# Patient Record
Sex: Female | Born: 1950 | Race: Black or African American | Hispanic: No | Marital: Single
Health system: Midwestern US, Community
[De-identification: ages and names within clinical notes are randomized; demographics above are authoritative.]

## PROBLEM LIST (undated history)

## (undated) DIAGNOSIS — M79671 Pain in right foot: Secondary | ICD-10-CM

## (undated) DIAGNOSIS — I120 Hypertensive chronic kidney disease with stage 5 chronic kidney disease or end stage renal disease: Secondary | ICD-10-CM

## (undated) DIAGNOSIS — Z01810 Encounter for preprocedural cardiovascular examination: Secondary | ICD-10-CM

---

## 2015-12-14 ENCOUNTER — Inpatient Hospital Stay: Admit: 2015-12-14 | Payer: MEDICARE | Primary: Internal Medicine

## 2015-12-14 ENCOUNTER — Encounter

## 2015-12-14 DIAGNOSIS — M109 Gout, unspecified: Secondary | ICD-10-CM

## 2015-12-26 ENCOUNTER — Ambulatory Visit: Admit: 2015-12-26 | Discharge: 2015-12-26 | Payer: MEDICARE | Attending: Vascular Surgery | Primary: Internal Medicine

## 2015-12-26 ENCOUNTER — Encounter: Attending: Vascular Surgery | Primary: Internal Medicine

## 2015-12-26 DIAGNOSIS — N189 Chronic kidney disease, unspecified: Secondary | ICD-10-CM

## 2015-12-26 NOTE — Progress Notes (Signed)
Tracey King    Chief Complaint   Patient presents with   ??? End Stage Renal Disease       History and Physical    [name] is a 65 year old female referred to our office for evaluation of long-term dialysis access.  She has never been on dialysis before and states that she is right-handed.  [place] is [place] and is establish new doctor this area.  Stands that dialysis may be imminent for her and is ready to discuss long-term access.  She has no active complaints at this time.    Past Medical History:   Diagnosis Date   ??? Chronic kidney disease    ??? Chronic obstructive pulmonary disease (HCC)    ??? Hypertension      Patient Active Problem List   Diagnosis Code   ??? Chronic kidney disease N18.9     Past Surgical History:   Procedure Laterality Date   ??? HX CHOLECYSTECTOMY     ??? HX GYN         Allergies   Allergen Reactions   ??? Reglan [Metoclopramide Hcl] Anxiety   ??? Erythromycin Nausea and Vomiting     Social History     Social History   ??? Marital status: MARRIED     Spouse name: N/A   ??? Number of children: N/A   ??? Years of education: N/A     Occupational History   ??? Not on file.     Social History Main Topics   ??? Smoking status: Former Smoker     Quit date: 05/06/1968   ??? Smokeless tobacco: Never Used   ??? Alcohol use No   ??? Drug use: No   ??? Sexual activity: Not on file     Other Topics Concern   ??? Not on file     Social History Narrative      History reviewed. No pertinent family history.    Review of Systems    Review of Systems   Constitutional: Negative for chills, diaphoresis, fever, malaise/fatigue and weight loss.   HENT: Negative for hearing loss and sore throat.    Eyes: Negative for blurred vision, photophobia and redness.   Respiratory: Negative for cough, hemoptysis, shortness of breath and wheezing.    Cardiovascular: Negative for chest pain, palpitations and orthopnea.   Gastrointestinal: Negative for abdominal pain, blood in stool,  constipation, diarrhea, heartburn, nausea and vomiting.   Genitourinary: Negative for dysuria, frequency, hematuria and urgency.   Musculoskeletal: Negative for back pain and myalgias.   Skin: Negative for itching and rash.   Neurological: Negative for dizziness, speech change, focal weakness, weakness and headaches.   Endo/Heme/Allergies: Does not bruise/bleed easily.   Psychiatric/Behavioral: Negative for depression and suicidal ideas.            Physical Exam:    Visit Vitals   ??? BP (!) 168/98   ??? Pulse 74   ??? Resp 18   ??? Ht 5\' 5"  (1.651 m)   ??? Wt 152 lb (68.9 kg)   ??? BMI 25.29 kg/m2      Physical Examination: General appearance - alert, well appearing, and in no distress  Mental status - alert, oriented to person, place, and time  Eyes - sclera anicteric, left eye normal, right eye normal  Ears - right ear normal, left ear normal  Nose - normal and patent, no erythema, discharge or polyps  Mouth - mucous membranes moist, pharynx normal without  lesions  Neck - supple, no significant adenopathy  Lymphatics - no palpable lymphadenopathy  Chest -coarse breath sounds bilaterally  Heart - normal rate and regular rhythm  Abdomen - soft, nontender, nondistended, no masses or organomegaly  Extremities -1+ radial pulse bilaterally.  No visible cephalic vein in the forearm region.  Small antecubital vein on the left is visible large on the right.    Impression and Plan:    ICD-10-CM ICD-9-CM    1. Chronic kidney disease, unspecified stage N18.9 585.9 DUPLEX UPPER EXT VEIN MAP BILAT     Orders Placed This Encounter   ??? DUPLEX UPPER EXT VEIN MAP BILAT     Ms. Tracey Loraassell and I had a long discussion with dialysis access.  We reviewed the need for a vein mapping study as well as the indications for where we put the dialysis access.  [name] voiced understanding of her plan and she will see us in 2 weeks to nail down date for dialysis access as well as to review her vein mapping study.    Follow-up Disposition:   Return in about 2 weeks (around 01/09/2016) for Vascular labs, surgery talk.      The treatment plan was reviewed with the patient in detail.  The patient voiced understanding of this plan and all questions and concerns were addressed.  The patient agrees with this plan.  We discussed the signs and symptoms that would require earlier attention or intervention.     The patient was given educational material related to his/her visit and the patient has voiced understanding of the material.     I appreciate the opportunity to participate in the care of your patient.  I will be sure to keep you informed of any subsequent changes in the treatment plan.  If you have any questions or concerns, please feel free to contact me.  Ouida SillsJason L Ailana Cuadrado, MD    PLEASE NOTE:  This document has been produced using voice recognition software. Unrecognized errors in transcription may be present.

## 2016-01-03 ENCOUNTER — Ambulatory Visit: Payer: MEDICARE | Primary: Internal Medicine

## 2016-01-05 ENCOUNTER — Inpatient Hospital Stay: Admit: 2016-01-05 | Payer: MEDICARE | Attending: Vascular Surgery | Primary: Internal Medicine

## 2016-01-05 DIAGNOSIS — N189 Chronic kidney disease, unspecified: Secondary | ICD-10-CM

## 2016-01-05 NOTE — Procedures (Signed)
[facility]  *** FINAL REPORT ***    Name: Tracey King, Tracey King  MRN: [id number]    Outpatient  DOB: 22 Nov 1950  HIS Order #: [id number]  TRAKnet Visit #: [id number]  Date: 05 Jan 2016    TYPE OF TEST: Peripheral Venous Testing    REASON FOR TEST  Surveillance    Right Arm:-  Deep venous thrombosis:           No  Superficial venous thrombosis:    No    Left Arm:-  Deep venous thrombosis:           No  Superficial venous thrombosis:    No    Vein Mapping:    Diam.   Depth  (mm)    Right Cephalic Vein:    Axilla:    Upper Arm:       2.9     6.6    Lower Arm:       2.6     1.9    Antecubital:     3.6     4.8    Upper Forearm:    Forearm:    Wrist:    Right Basilic Vein:    Upper Arm:       4.3     9.3    Lower Arm:       3.7     9.2    Antecubital:     2.9     6.9    Upper Forearm:   2.1     4.4    Lower Forearm:   2.3     3.5    Wrist:           1.7     2.2    R.Median Cubital:  2.4     4.7    Right Brachial Vein:    Proximal:        4.1    11.8    Distal:          2.9     5.5    Right Subclavian Artery:  Right Axillary Artery  :    Left Cephalic Vein:    Axilla:          0.0     0.0    Upper Arm:       2.0     0.0    Lower Arm:       1.7     0.0    Antecubital:     2.4     0.0    Upper Forearm:   1.4     0.0    Forearm:         1.2     0.0    Wrist:           1.5     0.0    Left Basilic Vein:    Upper Arm:       3.0     0.0    Lower Arm:       2.1     0.0    Antecubital:     2.3     0.0    Upper Forearm:      0.0    Lower Forearm:      0.0    Wrist:              0.0    L.Median Cubital:  2.5     2.6    Left Brachial Vein:  Proximal:        2.1    16.6    Distal:          2.3     6.1    Left Subclavian Artery:  Left Axillary Artery  :      INTERPRETATION/FINDINGS  Duplex images were obtained using 2-D gray scale, color flow, and  spectral Doppler analysis.  Bilateral mapping is done with the application of tourniquet.    Right arm:  1. Vein diameters as noted above.  2. No evidence of deep venous thrombosis  in brachial vein.  3. No evidence of superficial venous thrombosis in the superficial  veins.  4. Bifurcation noted distal to cubital fossa.  5. The cephalic vein in the forearm was too small to well visualize.    Left arm:  1. Vein diameters as noted above.  2. No evidence of deep venous thrombosis in brachial vein.  3. No evidence of superficial venous thrombosis in the superficial  veins.  4. Bifurcation noted distal to cubital fossa.  5. The basilic vein in the forearm was too small to well visualize.    ADDITIONAL COMMENTS  The bilateral brachial and radial arteries are widely patent.    I have personally reviewed the data relevant to the interpretation of  this  study.    TECHNOLOGIST: Susy Frizzle, RDCS, RVT  Signed: 01/05/2016 02:23 PM    PHYSICIAN: Tomasita Morrow. Fransisco Hertz, MD  Signed: 01/05/2016 03:13 PM

## 2016-01-05 NOTE — Procedures (Signed)
[facility]  *** FINAL REPORT ***    Name: Tracey King, Tracey King  MRN: [id number]    Outpatient  DOB: 22 Nov 1950  HIS Order #: [id number]  TRAKnet Visit #: [id number]  Date: 05 Jan 2016    TYPE OF TEST: Peripheral Venous Testing    REASON FOR TEST  Surveillance    Right Arm:-  Deep venous thrombosis:           No  Superficial venous thrombosis:    No    Left Arm:-  Deep venous thrombosis:           No  Superficial venous thrombosis:    No    Vein Mapping:    Diam.   Depth  (mm)    Right Cephalic Vein:    Axilla:    Upper Arm:       2.9     6.6    Lower Arm:       2.6     1.9    Antecubital:     3.6     4.8    Upper Forearm:    Forearm:    Wrist:    Right Basilic Vein:    Upper Arm:       4.3     9.3    Lower Arm:       3.7     9.2    Antecubital:     2.9     6.9    Upper Forearm:   2.1     4.4    Lower Forearm:   2.3     3.5    Wrist:           1.7     2.2    R.Median Cubital:  2.4     4.7    Right Brachial Vein:    Proximal:        4.1    11.8    Distal:          2.9     5.5    Right Subclavian Artery:  Right Axillary Artery  :    Left Cephalic Vein:    Axilla:          0.0     0.0    Upper Arm:       2.0     0.0    Lower Arm:       1.7     0.0    Antecubital:     2.4     0.0    Upper Forearm:   1.4     0.0    Forearm:         1.2     0.0    Wrist:           1.5     0.0    Left Basilic Vein:    Upper Arm:       3.0     0.0    Lower Arm:       2.1     0.0    Antecubital:     2.3     0.0    Upper Forearm:      0.0    Lower Forearm:      0.0    Wrist:              0.0    L.Median Cubital:  2.5     2.6    Left Brachial Vein:  Proximal:        2.1    16.6    Distal:          2.3     6.1    Left Subclavian Artery:  Left Axillary Artery  :      INTERPRETATION/FINDINGS  Duplex images were obtained using 2-D gray scale, color flow, and  spectral Doppler analysis.  Bilateral mapping is done with the application of tourniquet.    Right arm:  1. Vein diameters as noted above.   2. No evidence of deep venous thrombosis in brachial vein.  3. No evidence of superficial venous thrombosis in the superficial  veins.  4. Bifurcation noted distal to cubital fossa.  5. The cephalic vein in the forearm was too small to well visualize.    Left arm:  1. Vein diameters as noted above.  2. No evidence of deep venous thrombosis in brachial vein.  3. No evidence of superficial venous thrombosis in the superficial  veins.  4. Bifurcation noted distal to cubital fossa.  5. The basilic vein in the forearm was too small to well visualize.    ADDITIONAL COMMENTS  The bilateral brachial and radial arteries are widely patent.    I have personally reviewed the data relevant to the interpretation of  this  study.    TECHNOLOGIST: Susy FrizzleJarobvey Matthews, RDCS, RVT  Signed: 01/05/2016 02:23 PM    PHYSICIAN: Tomasita MorrowAndrew P. Fransisco HertzLoiacono, MD  Signed: 01/05/2016 03:13 PM

## 2016-01-10 ENCOUNTER — Ambulatory Visit: Attending: Vascular Surgery | Primary: Internal Medicine

## 2016-01-10 ENCOUNTER — Encounter: Attending: Vascular Surgery | Primary: Internal Medicine

## 2016-01-10 ENCOUNTER — Ambulatory Visit: Admit: 2016-01-10 | Discharge: 2016-01-10 | Payer: MEDICARE | Attending: Vascular Surgery | Primary: Internal Medicine

## 2016-01-10 DIAGNOSIS — N189 Chronic kidney disease, unspecified: Secondary | ICD-10-CM

## 2016-01-10 NOTE — Progress Notes (Signed)
[name]    Chief Complaint   Patient presents with   ??? End Stage Renal Disease       History and Physical    [name] returns to our office for discussion of long-term dialysis access.  She complains of a cough at this point which she states was previously better when her sarcoidosis is better controlled more recently after moving back to [place] she has noted increasing her cough.  She has no other complaints.  She is scheduled to see her new pulmonologist in this month.    Past Medical History:   Diagnosis Date   ??? Chronic kidney disease    ??? Chronic obstructive pulmonary disease (HCC)    ??? Hypertension      Patient Active Problem List   Diagnosis Code   ??? Chronic kidney disease N18.9     Past Surgical History:   Procedure Laterality Date   ??? HX CHOLECYSTECTOMY     ??? HX GYN       Current Outpatient Prescriptions   Medication Sig Dispense Refill   ??? aspirin 81 mg chewable tablet Take 81 mg by mouth daily.     ??? colchicine 0.6 mg tablet Take 0.6 mg by mouth daily.     ??? fluticasone-salmeterol (ADVAIR) 100-50 mcg/dose diskus inhaler Take 1 Puff by inhalation every twelve (12) hours.     ??? furosemide (LASIX) 20 mg tablet Take  by mouth daily.     ??? metoprolol succinate (TOPROL-XL) 25 mg XL tablet Take  by mouth daily.     ??? Polyethylene Glycol 3350 powd by Does Not Apply route.     ??? amLODIPine (NORVASC) 10 mg tablet Take  by mouth daily.     ??? calcitRIOL (ROCALTROL) 0.25 mcg capsule Take 0.25 mcg by mouth daily.     ??? doxazosin (CARDURA) 1 mg tablet Take  by mouth daily.       Allergies   Allergen Reactions   ??? Reglan [Metoclopramide Hcl] Anxiety   ??? Erythromycin Nausea and Vomiting     Social History     Social History   ??? Marital status: MARRIED     Spouse name: N/A   ??? Number of children: N/A   ??? Years of education: N/A     Occupational History   ??? Not on file.     Social History Main Topics   ??? Smoking status: Former Smoker     Quit date: 05/06/1968   ??? Smokeless tobacco: Never Used   ??? Alcohol use No    ??? Drug use: No   ??? Sexual activity: Not on file     Other Topics Concern   ??? Not on file     Social History Narrative      History reviewed. No pertinent family history.    Review of Systems    Review of Systems   Constitutional: Negative for chills, diaphoresis, fever, malaise/fatigue and weight loss.   HENT: Negative for hearing loss and sore throat.    Eyes: Negative for blurred vision, photophobia and redness.   Respiratory: Positive for cough. Negative for hemoptysis, shortness of breath and wheezing.    Cardiovascular: Negative for chest pain, palpitations and orthopnea.   Gastrointestinal: Negative for abdominal pain, blood in stool, constipation, diarrhea, heartburn, nausea and vomiting.   Genitourinary: Negative for dysuria, frequency, hematuria and urgency.   Musculoskeletal: Negative for back pain and myalgias.   Skin: Negative for itching and rash.   Neurological: Negative  for dizziness, speech change, focal weakness, weakness and headaches.   Endo/Heme/Allergies: Does not bruise/bleed easily.   Psychiatric/Behavioral: Negative for depression and suicidal ideas.            Physical Exam:    Visit Vitals   ??? BP 154/82 (BP 1 Location: Right arm, BP Patient Position: Sitting)   ??? Pulse 78   ??? Resp 18   ??? Ht 5\' 5"  (1.651 m)   ??? Wt 152 lb (68.9 kg)   ??? BMI 25.29 kg/m2      Physical Examination: General appearance - alert, well appearing, and in no distress  Mental status - alert, oriented to person, place, and time  Eyes - sclera anicteric, left eye normal, right eye normal  Ears - right ear normal, left ear normal  Nose - normal and patent, no erythema, discharge or polyps  Mouth - mucous membranes moist, pharynx normal without lesions  Neck - supple, no significant adenopathy  Lymphatics - no palpable lymphadenopathy  Chest -coarse breath sounds bilaterally  Heart - normal rate and regular rhythm  Abdomen - soft, nontender, nondistended, no masses or organomegaly   Extremities -positive antecubital vein on the left 2+ radial pulse bilaterally.    Impression and Plan:    ICD-10-CM ICD-9-CM    1. Chronic kidney disease, unspecified stage N18.9 585.9      Orders Placed This Encounter   ??? aspirin 81 mg chewable tablet   ??? calcitRIOL (ROCALTROL) 0.25 mcg capsule   ??? colchicine 0.6 mg tablet   ??? doxazosin (CARDURA) 1 mg tablet   ??? fluticasone-salmeterol (ADVAIR) 100-50 mcg/dose diskus inhaler   ??? furosemide (LASIX) 20 mg tablet   ??? metoprolol succinate (TOPROL-XL) 25 mg XL tablet   ??? Polyethylene Glycol 3350 powd   ??? amLODIPine (NORVASC) 10 mg tablet     I told [name] that she is a candidate for left forearm loop AV graft.  We do have clearance from her primary care doctor but we would need clearance in order to proceed with surgery from her pulmonologist.  We will send information over to her pulmonologist office with hopes we can get his clearance done as soon as possible so we can proceed with her access procedure.  Follow-up Disposition:  Return in about 4 weeks (around 02/07/2016).      The treatment plan was reviewed with the patient in detail.  The patient voiced understanding of this plan and all questions and concerns were addressed.  The patient agrees with this plan.  We discussed the signs and symptoms that would require earlier attention or intervention.     The patient was given educational material related to his/her visit and the patient has voiced understanding of the material.     I appreciate the opportunity to participate in the care of your patient.  I will be sure to keep you informed of any subsequent changes in the treatment plan.  If you have any questions or concerns, please feel free to contact me.  Ouida SillsJason L Anyiah Coverdale, MD    PLEASE NOTE:  This document has been produced using voice recognition software. Unrecognized errors in transcription may be present.

## 2016-02-09 NOTE — Telephone Encounter (Signed)
Called the patient and discussed with her scheduling the surgery for access and perm cath . Advised the patient I have her scheduled on 10/13 for access placement and perm cath placement, advised that Dr. Gracy BruinsAmini had sent over paperwork that she needed to have cath placement ASAP, discussed with Dr. Gracy BruinsAmini and he was good with cath and access being done on 10/13. Patient has appt with Dr. Gracy BruinsAmini on Tuesday and let her know I would call her after her appointment . Pt verbalized understanding.

## 2016-02-14 ENCOUNTER — Inpatient Hospital Stay: Payer: MEDICARE | Primary: Internal Medicine

## 2016-02-14 NOTE — Other (Signed)
Pt denies sleep apnea

## 2016-02-15 ENCOUNTER — Inpatient Hospital Stay: Admit: 2016-02-15 | Payer: MEDICARE | Primary: Internal Medicine

## 2016-02-15 DIAGNOSIS — Z01812 Encounter for preprocedural laboratory examination: Secondary | ICD-10-CM

## 2016-02-15 LAB — CBC WITH AUTOMATED DIFF
ABS. BASOPHILS: 0 10*3/uL (ref 0.0–0.06)
ABS. EOSINOPHILS: 0.2 10*3/uL (ref 0.0–0.4)
ABS. LYMPHOCYTES: 1.2 10*3/uL (ref 0.9–3.6)
ABS. MONOCYTES: 0.3 10*3/uL (ref 0.05–1.2)
ABS. NEUTROPHILS: 6.4 10*3/uL (ref 1.8–8.0)
BASOPHILS: 0 % (ref 0–2)
EOSINOPHILS: 2 % (ref 0–5)
HCT: 28.7 % — ABNORMAL LOW (ref 35.0–45.0)
HGB: 9.6 g/dL — ABNORMAL LOW (ref 12.0–16.0)
LYMPHOCYTES: 15 % — ABNORMAL LOW (ref 21–52)
MCH: 27.6 PG (ref 24.0–34.0)
MCHC: 33.4 g/dL (ref 31.0–37.0)
MCV: 82.5 FL (ref 74.0–97.0)
MONOCYTES: 4 % (ref 3–10)
MPV: 9.4 FL (ref 9.2–11.8)
NEUTROPHILS: 79 % — ABNORMAL HIGH (ref 40–73)
PLATELET: 219 10*3/uL (ref 135–420)
RBC: 3.48 M/uL — ABNORMAL LOW (ref 4.20–5.30)
RDW: 14.5 % (ref 11.6–14.5)
WBC: 8.2 10*3/uL (ref 4.6–13.2)

## 2016-02-15 LAB — METABOLIC PANEL, COMPREHENSIVE
A-G Ratio: 0.9 (ref 0.8–1.7)
ALT (SGPT): 10 U/L — ABNORMAL LOW (ref 13–56)
AST (SGOT): 12 U/L — ABNORMAL LOW (ref 15–37)
Albumin: 3.6 g/dL (ref 3.4–5.0)
Alk. phosphatase: 98 U/L (ref 45–117)
Anion gap: 13 mmol/L (ref 3.0–18)
BUN/Creatinine ratio: 8 — ABNORMAL LOW (ref 12–20)
BUN: 56 MG/DL — ABNORMAL HIGH (ref 7.0–18)
Bilirubin, total: 0.2 MG/DL (ref 0.2–1.0)
CO2: 24 mmol/L (ref 21–32)
Calcium: 9.2 MG/DL (ref 8.5–10.1)
Chloride: 106 mmol/L (ref 100–108)
Creatinine: 7.2 MG/DL — ABNORMAL HIGH (ref 0.6–1.3)
GFR est AA: 7 mL/min/{1.73_m2} — ABNORMAL LOW (ref >60)
GFR est non-AA: 6 mL/min/{1.73_m2} — ABNORMAL LOW (ref >60)
Globulin: 4 g/dL (ref 2.0–4.0)
Glucose: 126 mg/dL — ABNORMAL HIGH (ref 74–99)
Potassium: 4.3 mmol/L (ref 3.5–5.5)
Protein, total: 7.6 g/dL (ref 6.4–8.2)
Sodium: 143 mmol/L (ref 136–145)

## 2016-02-15 LAB — PTT: aPTT: 33.7 s (ref 23.0–36.4)

## 2016-02-15 LAB — TYPE & SCREEN
ABO/Rh(D): O POS
Antibody screen: NEGATIVE

## 2016-02-15 LAB — PROTHROMBIN TIME + INR
INR: 0.9 (ref 0.8–1.2)
Prothrombin time: 11.9 s (ref 11.5–15.2)

## 2016-02-15 LAB — TYPE AND SCREEN
ABO/Rh: O POS
Antibody Screen: NEGATIVE

## 2016-02-15 NOTE — Other (Signed)
Called Dr Izora RibasAndre's office for information on pre-op testing, and any Pulmonary information as noted in Dr Izora RibasAndre's records. VM, [facility] for return call as soon as possible so chart could be reviewed by Anesthesia.

## 2016-02-15 NOTE — Other (Signed)
Called Dr Regan RakersAhmeds office for last OV and EKG tracing, any other previous testing. To be faxed to PAT.

## 2016-02-15 NOTE — Other (Signed)
Spoke with Lynden Angathy at Dr Izora RibasAndre's office regarding labs and Pulmonary clearance. Cathy states pulmonary clearance and notes had been faxed yesterday with PCP. Only received PCP information. Cathy to re-fax Pulmonary clearance and notes. Informed Cathy Dr Regan RakersAhmeds office was to be faxing OV and EKG from there. Lynden AngCathy has call in for patient to call her back, she will ask patient about the pre-op testing when patient returns her call.

## 2016-02-16 ENCOUNTER — Ambulatory Visit: Admit: 2016-02-16 | Payer: MEDICARE | Primary: Internal Medicine

## 2016-02-16 ENCOUNTER — Inpatient Hospital Stay: Payer: MEDICARE

## 2016-02-16 LAB — POTASSIUM: Potassium: 4.4 mmol/L (ref 3.5–5.5)

## 2016-02-16 MED ORDER — HEPARIN (PORCINE) 1,000 UNIT/ML IJ SOLN
1000 unit/mL | INTRAMUSCULAR | Status: DC | PRN
Start: 2016-02-16 — End: 2016-02-16
  Administered 2016-02-16: 15:00:00 via INTRAMUSCULAR

## 2016-02-16 MED ORDER — HEPARIN (PORCINE) 1,000 UNIT/ML IJ SOLN
1000 unit/mL | INTRAMUSCULAR | Status: AC
Start: 2016-02-16 — End: ?

## 2016-02-16 MED ORDER — FENTANYL CITRATE (PF) 50 MCG/ML IJ SOLN
50 mcg/mL | INTRAMUSCULAR | Status: DC | PRN
Start: 2016-02-16 — End: 2016-02-16
  Administered 2016-02-16 (×2): via INTRAVENOUS

## 2016-02-16 MED ORDER — SODIUM CHLORIDE 0.9 % IV
1000 unit/mL | INTRAVENOUS | Status: DC | PRN
Start: 2016-02-16 — End: 2016-02-16
  Administered 2016-02-16: 14:00:00

## 2016-02-16 MED ORDER — MEPIVACAINE (PF) 15 MG/ML (1.5 %) INJECTION
15 mg/mL (1.5 %) | INTRAMUSCULAR | Status: DC | PRN
Start: 2016-02-16 — End: 2016-02-16
  Administered 2016-02-16: 13:00:00 via PERINEURAL

## 2016-02-16 MED ORDER — KETAMINE 10 MG/ML IJ SOLN
10 mg/mL | INTRAMUSCULAR | Status: DC | PRN
Start: 2016-02-16 — End: 2016-02-16
  Administered 2016-02-16 (×2): via INTRAVENOUS

## 2016-02-16 MED ORDER — FENTANYL CITRATE (PF) 50 MCG/ML IJ SOLN
50 mcg/mL | INTRAMUSCULAR | Status: AC
Start: 2016-02-16 — End: ?

## 2016-02-16 MED ORDER — OXYCODONE-ACETAMINOPHEN 7.5 MG-325 MG TAB
ORAL_TABLET | Freq: Four times a day (QID) | ORAL | 0 refills | Status: DC | PRN
Start: 2016-02-16 — End: 2016-08-21

## 2016-02-16 MED ORDER — BUPIVACAINE (PF) 0.25 % (2.5 MG/ML) IJ SOLN
0.252.5 % (2.5 mg/mL) | INTRAMUSCULAR | Status: DC | PRN
Start: 2016-02-16 — End: 2016-02-16
  Administered 2016-02-16: 15:00:00 via INTRAMUSCULAR

## 2016-02-16 MED ORDER — MIDAZOLAM 1 MG/ML IJ SOLN
1 mg/mL | INTRAMUSCULAR | Status: AC
Start: 2016-02-16 — End: ?

## 2016-02-16 MED ORDER — LIDOCAINE (PF) 10 MG/ML (1 %) IJ SOLN
10 mg/mL (1 %) | INTRAMUSCULAR | Status: AC
Start: 2016-02-16 — End: ?

## 2016-02-16 MED ORDER — KETAMINE 50 MG/5 ML (10 MG/ML) IN NS IV SYRINGE
50 mg/5 mL (10 mg/mL) | INTRAVENOUS | Status: AC
Start: 2016-02-16 — End: ?

## 2016-02-16 MED ORDER — PROPOFOL 10 MG/ML IV EMUL
10 mg/mL | INTRAVENOUS | Status: DC | PRN
Start: 2016-02-16 — End: 2016-02-16
  Administered 2016-02-16: 13:00:00 via INTRAVENOUS

## 2016-02-16 MED ORDER — MIDAZOLAM 1 MG/ML IJ SOLN
1 mg/mL | INTRAMUSCULAR | Status: DC | PRN
Start: 2016-02-16 — End: 2016-02-16
  Administered 2016-02-16 (×2): via INTRAVENOUS

## 2016-02-16 MED ORDER — HEPARIN (PORCINE) IN NS (PF) 1,000 UNIT/500 ML IV
1000 unit/500 mL | INTRAVENOUS | Status: AC
Start: 2016-02-16 — End: ?

## 2016-02-16 MED ORDER — KETOROLAC TROMETHAMINE 30 MG/ML INJECTION
30 mg/mL (1 mL) | INTRAMUSCULAR | Status: AC
Start: 2016-02-16 — End: ?

## 2016-02-16 MED ORDER — BUPIVACAINE (PF) 0.25 % (2.5 MG/ML) IJ SOLN
0.25 % (2.5 mg/mL) | INTRAMUSCULAR | Status: AC
Start: 2016-02-16 — End: ?

## 2016-02-16 MED ORDER — GLYCOPYRROLATE 0.2 MG/ML IJ SOLN
0.2 mg/mL | INTRAMUSCULAR | Status: AC
Start: 2016-02-16 — End: ?

## 2016-02-16 MED ORDER — HEPARIN (PORCINE) 1,000 UNIT/ML IJ SOLN
1000 unit/mL | INTRAMUSCULAR | Status: DC | PRN
Start: 2016-02-16 — End: 2016-02-16
  Administered 2016-02-16: 13:00:00 via INTRAVENOUS

## 2016-02-16 MED ORDER — GLYCOPYRROLATE 0.2 MG/ML IJ SOLN
0.2 mg/mL | INTRAMUSCULAR | Status: DC | PRN
Start: 2016-02-16 — End: 2016-02-16
  Administered 2016-02-16: 13:00:00 via INTRAVENOUS

## 2016-02-16 MED ORDER — SODIUM CHLORIDE 0.9 % IV
INTRAVENOUS | Status: DC
Start: 2016-02-16 — End: 2016-02-16
  Administered 2016-02-16 (×2): via INTRAVENOUS

## 2016-02-16 MED ORDER — SODIUM CHLORIDE 0.9 % IV
INTRAVENOUS | Status: DC
Start: 2016-02-16 — End: 2016-02-16

## 2016-02-16 MED ORDER — CEFAZOLIN 2 GM/50 ML IN DEXTROSE (ISO-OSMOTIC) IVPB
2 gram/50 mL | Freq: Once | INTRAVENOUS | Status: AC
Start: 2016-02-16 — End: 2016-02-16
  Administered 2016-02-16: 13:00:00 via INTRAVENOUS

## 2016-02-16 MED ORDER — LIDOCAINE-EPINEPHRINE PF 2 %-1:200,000 IJ SOLN
2 %-1:00,000 | INTRAMUSCULAR | Status: DC | PRN
Start: 2016-02-16 — End: 2016-02-16
  Administered 2016-02-16: 12:00:00 via SUBCUTANEOUS

## 2016-02-16 MED FILL — SODIUM CHLORIDE 0.9 % IV: INTRAVENOUS | Qty: 1000

## 2016-02-16 MED FILL — MIDAZOLAM 1 MG/ML IJ SOLN: 1 mg/mL | INTRAMUSCULAR | Qty: 5

## 2016-02-16 MED FILL — XYLOCAINE-MPF 10 MG/ML (1 %) INJECTION SOLUTION: 10 mg/mL (1 %) | INTRAMUSCULAR | Qty: 5

## 2016-02-16 MED FILL — HEPARIN (PORCINE) 1,000 UNIT/500 ML (2 UNIT/ML) IN 0.9 % NACL IV SOLN: 1000 unit/500 mL (2 unit/mL) | INTRAVENOUS | Qty: 500

## 2016-02-16 MED FILL — SENSORCAINE-MPF 0.25 % (2.5 MG/ML) INJECTION SOLUTION: 0.25 % (2.5 mg/mL) | INTRAMUSCULAR | Qty: 30

## 2016-02-16 MED FILL — HEPARIN (PORCINE) 1,000 UNIT/ML IJ SOLN: 1000 unit/mL | INTRAMUSCULAR | Qty: 10

## 2016-02-16 MED FILL — KETOROLAC TROMETHAMINE 30 MG/ML INJECTION: 30 mg/mL (1 mL) | INTRAMUSCULAR | Qty: 1

## 2016-02-16 MED FILL — MIDAZOLAM 1 MG/ML IJ SOLN: 1 mg/mL | INTRAMUSCULAR | Qty: 2

## 2016-02-16 MED FILL — FENTANYL CITRATE (PF) 50 MCG/ML IJ SOLN: 50 mcg/mL | INTRAMUSCULAR | Qty: 2

## 2016-02-16 MED FILL — KETAMINE 50 MG/5 ML (10 MG/ML) IN NS IV SYRINGE: 50 mg/5 mL (10 mg/mL) | INTRAVENOUS | Qty: 5

## 2016-02-16 MED FILL — CEFAZOLIN 2 GM/50 ML IN DEXTROSE (ISO-OSMOTIC) IVPB: 2 gram/50 mL | INTRAVENOUS | Qty: 50

## 2016-02-16 MED FILL — GLYCOPYRROLATE 0.2 MG/ML IJ SOLN: 0.2 mg/mL | INTRAMUSCULAR | Qty: 2

## 2016-02-16 NOTE — Anesthesia Procedure Notes (Signed)
Peripheral Block    Performed by: Christene SlatesBAINES, Lannis Lichtenwalner F  Authorized by: Christene SlatesBAINES, Sharmayne Jablon F       Pre-procedure:   Indications: at surgeon's request and post-op pain management    Preanesthetic Checklist: patient identified, risks and benefits discussed, site marked, timeout performed and patient being monitored      Block Type:   Block Type:  Supraclavicular  Monitoring:  Standard ASA monitoring, continuous pulse ox, frequent vital sign checks, heart rate, responsive to questions and oxygen  Injection Technique:  Single shot  Procedures: ultrasound guided    Prep: chlorhexidine    Needle Localization:  Ultrasound guidance    Assessment:  Number of attempts:  1  Injection Assessment:  Incremental injection every 5 mL, negative aspiration for CSF, negative aspiration for blood, no intravascular symptoms, no paresthesia, ultrasound image on chart and local visualized surrounding nerve on ultrasound  Patient tolerance:  Patient tolerated the procedure well with no immediate complications  Supraclavicular Nerve Block    Interscalene nerve block requested by surgeon for pain control.   Risks, benefits, alternatives explained and patient agrees to proceed.   Time out performed and site for block and surgery identified.    Standard monitors applied, 3 L NC O2, and sedation given to achieve patient comfort and anxiolysis, but maintain meaningful verbal contact:    Ultrasound image to locate plexus and determine optimal needle insertion site.   Sterile prep chlorhexidine followed by 2-4 mL 2% lidocaine local at insertion site.      A 90 mm, 22G insulated Echostim needle was inserted. Guided directly with ultrasound     30 ml 0.5% ropivacaine was injected without resistance and with gentle aspiration every 3-5 ml with direct visualization with ultrasound.   10  ml "corner pocket", 20 ml upper nerve group.     Entire procedure performed prior to anesthesia start time.   No pain, paresthesias, or blood were noted. No complications.   VSS throughout.  Ultrasound Images x 2 Recorded:

## 2016-02-16 NOTE — Op Note (Signed)
[facility] MEDICAL CENTER  OPERATIVE REPORT    Name:  Tracey King, Tracey King  MR#:  [id number]  DOB:  1950/09/12  Account #:  [id number]  Date of Adm:  02/16/2016  Date of Surgery:  02/16/2016      PREOPERATIVE DIAGNOSIS: Chronic kidney disease in need of  pending dialysis.    POSTOPERATIVE DIAGNOSIS: Chronic kidney disease in need of  pending dialysis.    PROCEDURES PERFORMED  1. Placement of left forearm loop arteriovenous graft.  2. Ultrasound-guided percutaneous access of right internal jugular  vein.  3. Placement of right internal jugular vein tunneled dialysis catheter.    SURGEON: Danella Maiers, MD.    ANESTHESIA: Regional with local.    PACKS AND DRAINS: None.    IMPLANTS: A 4-7 mm tapered Propaten graft and a 19 cm Palindrome  catheter.    COMPLICATIONS: None.    ESTIMATED BLOOD LOSS: 50 mL.    SPECIMENS REMOVED: None    CONDITION: To recovery stable.    FINDINGS: Good thrill throughout the arteriovenous graft with a  palpable radial pulse and hemostasis at the end of the procedure.  Satisfactory placement of tunneled dialysis catheter into atriocaval  junction with good blood return noted on flushing.    INDICATIONS FOR PROCEDURE: The patient is a 65 year old  female who presented to [facility] after being  evaluated in our office for long-term dialysis access. After a thorough  history and physical and a vein mapping study, it was determined the  patient would be a candidate for a loop forearm graft and so informed  consent was obtained.    DESCRIPTION OF PROCEDURE: On 02/16/2016, the patient  presented to the catheterization suite, identified by name and ID  bracelet by myself and the entire operative team. Once this was done,  the patient was placed on the operating table in supine position. After  appropriate depth of anesthesia obtained, the patient was prepped and  draped and time-out performed. The patient received preoperative  dose of antibiotics and then a transverse  incision was made 2  fingerbreadths above the antecubital fossa. Once this was done, using  blunt and sharp dissection to dissect out the antecubital vein and the  brachial artery, we placed vessel loops for proximal and distal control  and performed a counter incision in the distal forearm and then  tunneled a 4-7 mm Propaten graft with the 4 mm end towards the  brachial artery and the 7 mm end towards the antecubital vein. Once  this was done, we heparinized the patient appropriately and then  occluded the brachial artery and made a longitudinal arteriotomy and  instilled the 4 mm end of the graft to the brachial artery using an end-  to-side anastomosis. We then flushed the brachial artery through the  graft before starting flow to the hand. Once this was complete, we then  made a longitudinal venotomy and then performed an end-to-side  anastomosis between the 7 mm Propaten graft  and the antecubital  vein. Finally, once we finished that, before completing the  anastomosis, we again flushed the graft through the artery and through  the graft. We then back bled both veins and instilled heparinized saline  to the anastomosis. We then completed our anastomosis and  confirmed hemostasis. We had a good thrill throughout the graft and a  palpable radial pulse. Once we confirmed hemostasis, we then closed  both incisions with interrupted Vicryl, followed by a running 4-0  Monocryl for  skin, and Dermabond.    We then reprepped and draped the patient and then interrogated the  right internal jugular vein. It was compressible and felt to be appropriate  site for access. Using ultrasound, we obtained percutaneous  ultrasound-guided access of right internal jugular vein and advanced a  Bentson wire down to the inferior vena cava. We then made a counter  incision in the patient's chest wall to tunnel the 19 cm Palindrome  catheter from the chest wall up to our puncture site. We then passed 2  dilators over the wire, followed  by a peel-away sheath into the  atriocaval junction. We then advanced the catheter through the peel-  away sheath into the atriocaval junction. We confirmed placement by  fluoroscopy. We had good blood return and no resistance with flushing.  After these results, we closed our puncture site with a running  Monocryl suture followed by a nylon suture to the skin and we flushed  the catheter clear and instilled concentrated heparin into both lumens of the  catheter. At the end of the procedure, I was present and scrubbed for  the entire procedure. All counts were correct x2.        Danella MaiersJASON Batina Dougan, MD    Zerita BoersJA / DLR  D:  02/16/2016   12:43  T:  02/16/2016   14:15  Job #:  [id number]

## 2016-02-16 NOTE — Op Note (Signed)
[facility]  OPERATIVE REPORT    Name:  Tracey King, Tracey King  MR#:  [id number]  DOB:  1950-10-28  Account #:  [id number]  Date of Adm:  02/16/2016  Date of Surgery:  02/16/2016      PREOPERATIVE DIAGNOSIS: Chronic kidney disease in need of  pending dialysis.    POSTOPERATIVE DIAGNOSIS: Chronic kidney disease in need of  pending dialysis.    PROCEDURES PERFORMED  1. Placement of left forearm loop arteriovenous graft.  2. Ultrasound-guided percutaneous access of right internal jugular  vein.  3. Placement of right internal jugular vein tunneled dialysis catheter.    SURGEON: Danella MaiersJason Kaylynn Chamblin, MD.    ANESTHESIA: Regional with local.    PACKS AND DRAINS: None.    IMPLANTS: A 4-7 mm tapered Propaten graft and a 19 cm Palindrome  catheter.    COMPLICATIONS: None.    ESTIMATED BLOOD LOSS: 50 mL.    SPECIMENS REMOVED: None    CONDITION: To recovery stable.    FINDINGS: Good thrill throughout the arteriovenous graft with a  palpable radial pulse and hemostasis at the end of the procedure.  Satisfactory placement of tunneled dialysis catheter into atriocaval  junction with good blood return noted on flushing.    INDICATIONS FOR PROCEDURE: The patient is a 65 year old  female who [facility] after being  evaluated in our office for long-term dialysis access. After a thorough  history and physical and a vein mapping study, it was determined the  patient would be a candidate for a loop forearm graft and so informed  consent was obtained.    DESCRIPTION OF PROCEDURE: On 02/16/2016, the patient  presented to the catheterization suite, identified by name and ID  bracelet by myself and the entire operative team. Once this was done,  the patient was placed on the operating table in supine position. After  appropriate depth of anesthesia obtained, the patient was prepped and  draped and time-out performed. The patient received preoperative   dose of antibiotics and then a transverse incision was made 2  fingerbreadths above the antecubital fossa. Once this was done, using  blunt and sharp dissection to dissect out the antecubital vein and the  brachial artery, we placed vessel loops for proximal and distal control  and performed a counter incision in the distal forearm and then  tunneled a 4-7 mm Propaten graft with the 4 mm end towards the  brachial artery and the 7 mm end towards the antecubital vein. Once  this was done, we heparinized the patient appropriately and then  occluded the brachial artery and made a longitudinal arteriotomy and  instilled the 4 mm end of the graft to the brachial artery using an end-  to-side anastomosis. We then flushed the brachial artery through the  graft before starting flow to the hand. Once this was complete, we then  made a longitudinal venotomy and then performed an end-to-side  anastomosis between the 7 mm Propaten graft  and the antecubital  vein. Finally, once we finished that, before completing the  anastomosis, we again flushed the graft through the artery and through  the graft. We then back bled both veins and instilled heparinized saline  to the anastomosis. We then completed our anastomosis and  confirmed hemostasis. We had a good thrill throughout the graft and a  palpable radial pulse. Once we confirmed hemostasis, we then closed  both incisions with interrupted Vicryl, followed by a running 4-0  Monocryl for  skin, and Dermabond.    We then reprepped and draped the patient and then interrogated the  right internal jugular vein. It was compressible and felt to be appropriate  site for access. Using ultrasound, we obtained percutaneous  ultrasound-guided access of right internal jugular vein and advanced a  Bentson wire down to the inferior vena cava. We then made a counter  incision in the patient's chest wall to tunnel the 19 cm Palindrome   catheter from the chest wall up to our puncture site. We then passed 2  dilators over the wire, followed by a peel-away sheath into the  atriocaval junction. We then advanced the catheter through the peel-  away sheath into the atriocaval junction. We confirmed placement by  fluoroscopy. We had good blood return and no resistance with flushing.  After these results, we closed our puncture site with a running  Monocryl suture followed by a nylon suture to the skin and we flushed  the catheter clear and instilled concentrated heparin into both lumens of the  catheter. At the end of the procedure, I was present and scrubbed for  the entire procedure. All counts were correct x2.        Danella Maiers, MD    [name]  D:  02/16/2016   12:43  T:  02/16/2016   14:15  Job #:  [id number]

## 2016-02-16 NOTE — Other (Signed)
Assistant patient wipe surgical sites.

## 2016-02-16 NOTE — Brief Op Note (Signed)
BRIEF OPERATIVE NOTE    Date of Procedure: 02/16/2016   Preoperative Diagnosis: END STAGE RENAL DISEASE  Postoperative Diagnosis: END STAGE RENAL DISEASE    Procedure(s):  LEFT ARM CREATION ARTERIO VENOUS GRAFT W/PERM CATH PLACEMENT **SPEC POP**  Surgeon(s) and Role:     * Ouida SillsJason L Thomasena Vandenheuvel, MD - Primary         Assistant Staff:       Surgical Staff:  Circ-1: Alfonso RamusSamantha Pope, RN  Circ-Relief: Everlena Cooperawn F Sanders, RN  Radiology Technician: Priscille HeidelbergLaura K Mumford  Scrub Tech-1: Leodis Binetharles Whittington  Surg Asst-1: Yvonne KendallSandra Brown  Event Time In   Incision Start [id number]   Incision Close 1051     Anesthesia: MAC   Estimated Blood Loss: 50mL  Specimens: * No specimens in log *   Findings: Good thrill in graft and palpable radial pulse.  TDC in good position with no resistance to flushing and good blood return   Complications: None  Implants:   Implant Name Type Inv. Item Serial No. Manufacturer Lot No. LRB No. Used Action   GRAFT EPTFE-HEPRN TAPR 4-7X10 -- PROPATEN - [id number]  GRAFT EPTFE-HEPRN TAPR 4-7X10 -- PROPATEN [id number] [employer] 0 Left 1 Implanted   chronic dual lumen catheter       COVIDIEN [id number] Right 1 Implanted

## 2016-02-16 NOTE — Anesthesia Procedure Notes (Signed)
Peripheral Block    Performed by: Christene SlatesBAINES, Darrell Hauk F  Authorized by: Christene SlatesBAINES, Reinaldo Helt F       Pre-procedure:   Indications: at surgeon's request and post-op pain management    Preanesthetic Checklist: patient identified, risks and benefits discussed, site marked, timeout performed and patient being monitored      Block Type:   Block Type:  Supraclavicular  Monitoring:  Standard ASA monitoring, continuous pulse ox, frequent vital sign checks, heart rate, responsive to questions and oxygen  Injection Technique:  Single shot  Procedures: ultrasound guided    Prep: chlorhexidine    Needle Localization:  Ultrasound guidance    Assessment:  Number of attempts:  1  Injection Assessment:  Incremental injection every 5 mL, negative aspiration for CSF, negative aspiration for blood, no intravascular symptoms, no paresthesia, ultrasound image on chart and local visualized surrounding nerve on ultrasound  Patient tolerance:  Patient tolerated the procedure well with no immediate complications  Supraclavicular Nerve Block    Interscalene nerve block requested by surgeon for pain control.   Risks, benefits, alternatives explained and patient agrees to proceed.   Time out performed and site for block and surgery identified.    Standard monitors applied, 3 L NC O2, and sedation given to achieve patient comfort and anxiolysis, but maintain meaningful verbal contact:    Ultrasound image to locate plexus and determine optimal needle insertion site.   Sterile prep chlorhexidine followed by 2-4 mL 2% lidocaine local at insertion site.      A 90 mm, 22G insulated Echostim needle was inserted. Guided directly with ultrasound     30 ml 0.5% ropivacaine was injected without resistance and with gentle aspiration every 3-5 ml with direct visualization with ultrasound.   10  ml "corner pocket", 20 ml upper nerve group.     Entire procedure performed prior to anesthesia start time.    No pain, paresthesias, or blood were noted. No complications.  VSS throughout.  Ultrasound Images x 2 Recorded:

## 2016-02-16 NOTE — Anesthesia Post-Procedure Evaluation (Signed)
Post-Anesthesia Evaluation and Assessment    Cardiovascular Function/Vital Signs  Visit Vitals   ??? BP 133/82   ??? Pulse 91   ??? Temp 36.7 ??C (98.1 ??F)   ??? Resp 20   ??? SpO2 96%       Patient is status post Procedure(s):  LEFT ARM CREATION ARTERIO VENOUS GRAFT W/PERM CATH PLACEMENT **SPEC POP**.    Nausea/Vomiting: Controlled.    Postoperative hydration reviewed and adequate.    Pain:  Pain Scale 1: Numeric (0 - 10) (02/16/16 1150)  Pain Intensity 1: 0 (02/16/16 1150)   Managed.    Neurological Status:   Neuro (WDL): Exceptions to WDL (02/16/16 1105)   At baseline.    Mental Status and Level of Consciousness: Arousable.    Pulmonary Status:   O2 Device: Nasal cannula (02/16/16 1105)   Adequate oxygenation and airway patent.    Complications related to anesthesia: None    Post-anesthesia assessment completed. No concerns.    Patient has met all discharge requirements.    Signed By: Aggie Moats, MD    February 16, 2016

## 2016-02-16 NOTE — Anesthesia Pre-Procedure Evaluation (Signed)
Anesthetic History   No history of anesthetic complications     Pertinent negatives: No PONV       Review of Systems / Medical History  Patient summary reviewed, nursing notes reviewed and pertinent labs reviewed    Pulmonary    COPD (sarcoid also): moderate          Pertinent negatives: No smoker     Neuro/Psych   Within defined limits           Cardiovascular    Hypertension: well controlled        Dysrhythmias     Pertinent negatives: No past MI and CAD       GI/Hepatic/Renal     GERD: well controlled    Renal disease: ESRD    Pertinent negatives: No liver disease   Endo/Other  Within defined limits        Pertinent negatives: No diabetes   Other Findings   Comments: etoh -           Physical Exam    Airway  Mallampati: I  TM Distance: 4 - 6 cm  Neck ROM: normal range of motion   Mouth opening: Normal     Cardiovascular    Rhythm: regular  Rate: normal         Dental      Comments: Some missing   Pulmonary  Breath sounds clear to auscultation               Abdominal         Other Findings            Anesthetic Plan    ASA: 4  Anesthesia type: regional and MAC - supraclavicular block          Induction: Intravenous  Anesthetic plan and risks discussed with: Patient

## 2016-02-16 NOTE — Other (Signed)
Reviewed PTA medication list with patient/caregiver and patient/caregiver denies any additional medications. Patient admits to having a responsible adult care for them for at least 24 hours after surgery.  ??

## 2016-02-16 NOTE — H&P (Signed)
History and Physical    [name] returns to our office for discussion of long-term dialysis access.  She complains of a cough at this point which she states was previously better when her sarcoidosis is better controlled more recently after moving back to [place] she has noted increasing her cough.  She has no other complaints.  She is scheduled to see her new pulmonologist in this month.  ??       Past Medical History:   Diagnosis Date   ??? Chronic kidney disease ??   ??? Chronic obstructive pulmonary disease (HCC) ??   ??? Hypertension ??   ??       Patient Active Problem List   Diagnosis Code   ??? Chronic kidney disease N18.9   ??  Past Surgical History:   Procedure Laterality Date   ??? HX CHOLECYSTECTOMY ?? ??   ??? HX GYN ?? ??   ??         Current Outpatient Prescriptions   Medication Sig Dispense Refill   ??? aspirin 81 mg chewable tablet Take 81 mg by mouth daily. ?? ??   ??? colchicine 0.6 mg tablet Take 0.6 mg by mouth daily. ?? ??   ??? fluticasone-salmeterol (ADVAIR) 100-50 mcg/dose diskus inhaler Take 1 Puff by inhalation every twelve (12) hours. ?? ??   ??? furosemide (LASIX) 20 mg tablet Take  by mouth daily. ?? ??   ??? metoprolol succinate (TOPROL-XL) 25 mg XL tablet Take  by mouth daily. ?? ??   ??? Polyethylene Glycol 3350 powd by Does Not Apply route. ?? ??   ??? amLODIPine (NORVASC) 10 mg tablet Take  by mouth daily. ?? ??   ??? calcitRIOL (ROCALTROL) 0.25 mcg capsule Take 0.25 mcg by mouth daily. ?? ??   ??? doxazosin (CARDURA) 1 mg tablet Take  by mouth daily. ?? ??   ??       Allergies   Allergen Reactions   ??? Reglan [Metoclopramide Hcl] Anxiety   ??? Erythromycin Nausea and Vomiting   ??  Social History   ??        Social History   ??? Marital status: MARRIED   ?? ?? Spouse name: N/A   ??? Number of children: N/A   ??? Years of education: N/A   ??      Occupational History   ??? Not on file.   ??        Social History Main Topics   ??? Smoking status: Former Smoker   ?? ?? Quit date: 05/06/1968   ??? Smokeless tobacco: Never Used   ??? Alcohol use No   ??? Drug use: No    ??? Sexual activity: Not on file   ??       Other Topics Concern   ??? Not on file   ??  Social History Narrative      History reviewed. No pertinent family history.  ??  Review of Systems    Review of Systems   Constitutional: Negative for chills, diaphoresis, fever, malaise/fatigue and weight loss.   HENT: Negative for hearing loss and sore throat.    Eyes: Negative for blurred vision, photophobia and redness.   Respiratory: Positive for cough. Negative for hemoptysis, shortness of breath and wheezing.    Cardiovascular: Negative for chest pain, palpitations and orthopnea.   Gastrointestinal: Negative for abdominal pain, blood in stool, constipation, diarrhea, heartburn, nausea and vomiting.   Genitourinary: Negative for dysuria, frequency, hematuria and urgency.   Musculoskeletal: Negative  for back pain and myalgias.   Skin: Negative for itching and rash.   Neurological: Negative for dizziness, speech change, focal weakness, weakness and headaches.   Endo/Heme/Allergies: Does not bruise/bleed easily.   Psychiatric/Behavioral: Negative for depression and suicidal ideas.   ??  ??  ??  ????  Physical Exam:         Visit Vitals   ??? BP 154/82 (BP 1 Location: Right arm, BP Patient Position: Sitting)   ??? Pulse 78   ??? Resp 18   ??? Ht 5\' 5"  (1.651 m)   ??? Wt 152 lb (68.9 kg)   ??? BMI 25.29 kg/m2      Physical Examination: General appearance - alert, well appearing, and in no distress  Mental status - alert, oriented to person, place, and time  Eyes - sclera anicteric, left eye normal, right eye normal  Ears - right ear normal, left ear normal  Nose - normal and patent, no erythema, discharge or polyps  Mouth - mucous membranes moist, pharynx normal without lesions  Neck - supple, no significant adenopathy  Lymphatics - no palpable lymphadenopathy  Chest -coarse breath sounds bilaterally  Heart - normal rate and regular rhythm  Abdomen - soft, nontender, nondistended, no masses or organomegaly   Extremities -positive antecubital vein on the left 2+ radial pulse bilaterally.  ??  Impression and Plan:  ?? ?? ICD-10-CM ICD-9-CM ??   1. Chronic kidney disease, unspecified stage N18.9 585.9 ??   ??      Orders Placed This Encounter   ??? aspirin 81 mg chewable tablet   ??? calcitRIOL (ROCALTROL) 0.25 mcg capsule   ??? colchicine 0.6 mg tablet   ??? doxazosin (CARDURA) 1 mg tablet   ??? fluticasone-salmeterol (ADVAIR) 100-50 mcg/dose diskus inhaler   ??? furosemide (LASIX) 20 mg tablet   ??? metoprolol succinate (TOPROL-XL) 25 mg XL tablet   ??? Polyethylene Glycol 3350 powd   ??? amLODIPine (NORVASC) 10 mg tablet   ??  I told Tracey King that she is a candidate for left forearm loop AV graft.  We will place this access as well as a tunneled dialysis catheter today.

## 2016-02-19 MED FILL — PROPOFOL 10 MG/ML IV EMUL: 10 mg/mL | INTRAVENOUS | Qty: 17

## 2016-02-19 MED FILL — GLYCOPYRROLATE 0.2 MG/ML IJ SOLN: 0.2 mg/mL | INTRAMUSCULAR | Qty: 0.5

## 2016-02-19 MED FILL — XYLOCAINE-MPF/EPINEPHRINE 2 %-1:200,000 INJECTION SOLUTION: 2 %-1:00,000 | INTRAMUSCULAR | Qty: 3

## 2016-02-19 MED FILL — HEPARIN (PORCINE) 1,000 UNIT/ML IJ SOLN: 1000 unit/mL | INTRAMUSCULAR | Qty: 4

## 2016-02-19 MED FILL — CARBOCAINE (PF) 15 MG/ML (1.5 %) INJECTION SOLUTION: 15 mg/mL (1.5 %) | INTRAMUSCULAR | Qty: 30

## 2016-02-19 MED FILL — ONDANSETRON (PF) 4 MG/2 ML INJECTION: 4 mg/2 mL | INTRAMUSCULAR | Qty: 2

## 2016-02-29 ENCOUNTER — Encounter

## 2016-03-06 ENCOUNTER — Ambulatory Visit: Admit: 2016-03-06 | Discharge: 2016-03-06 | Payer: MEDICARE | Attending: Vascular Surgery | Primary: Internal Medicine

## 2016-03-06 DIAGNOSIS — N186 End stage renal disease: Secondary | ICD-10-CM

## 2016-03-06 NOTE — Progress Notes (Signed)
Tracey King    Chief Complaint   Patient presents with   ??? End Stage Renal Disease       History and Physical    Tracey King returns to our office for follow up after undergoing a left forearm loop AV graft. She has no complaints.     Past Medical History:   Diagnosis Date   ??? Arrhythmia     intermittent "racing heart"   ??? Chronic kidney disease     ESRD, Stage IV   ??? Chronic obstructive pulmonary disease (HCC)    ??? GERD (gastroesophageal reflux disease)    ??? Hypertension    ??? Rheumatic fever 1970's   ??? Sarcoidosis      Patient Active Problem List   Diagnosis Code   ??? Chronic kidney disease N18.9     Past Surgical History:   Procedure Laterality Date   ??? HX LAP CHOLECYSTECTOMY     ??? HX TAH AND BSO       Current Outpatient Prescriptions   Medication Sig Dispense Refill   ??? oxyCODONE-acetaminophen (PERCOCET 7.5) 7.5-325 mg per tablet Take 2 Tabs by mouth every six (6) hours as needed for Pain. Max Daily Amount: 8 Tabs. 42 Tab 0   ??? albuterol (PROVENTIL HFA, VENTOLIN HFA, PROAIR HFA) 90 mcg/actuation inhaler Take 2 Puffs by inhalation every four (4) hours as needed for Wheezing.     ??? cholecalciferol (VITAMIN D3) 1,000 unit tablet Take 2,000 Units by mouth daily.     ??? tiotropium bromide (SPIRIVA RESPIMAT) 2.5 mcg/actuation inhaler Take 2 Puffs by inhalation daily.     ??? aspirin 81 mg chewable tablet Take 81 mg by mouth daily.     ??? calcitRIOL (ROCALTROL) 0.25 mcg capsule Take 0.25 mcg by mouth daily.     ??? colchicine 0.6 mg tablet Take 0.6 mg by mouth daily.     ??? fluticasone-salmeterol (ADVAIR) 100-50 mcg/dose diskus inhaler Take 1 Puff by inhalation every twelve (12) hours.     ??? furosemide (LASIX) 20 mg tablet Take  by mouth daily.     ??? metoprolol succinate (TOPROL-XL) 25 mg XL tablet Take  by mouth daily.     ??? Polyethylene Glycol 3350 powd by Does Not Apply route.     ??? amLODIPine (NORVASC) 10 mg tablet Take  by mouth daily.       Allergies   Allergen Reactions   ??? Erythromycin Nausea and Vomiting    ??? Reglan [Metoclopramide Hcl] Anxiety     Social History     Social History   ??? Marital status: MARRIED     Spouse name: N/A   ??? Number of children: N/A   ??? Years of education: N/A     Occupational History   ??? Not on file.     Social History Main Topics   ??? Smoking status: Former Smoker     Quit date: 05/06/1968   ??? Smokeless tobacco: Never Used   ??? Alcohol use No   ??? Drug use: No   ??? Sexual activity: No     Other Topics Concern   ??? Not on file     Social History Narrative      History reviewed. No pertinent family history.    Review of Systems    Review of Systems   Constitutional: Negative for chills, diaphoresis, fever, malaise/fatigue and weight loss.   HENT: Negative for hearing loss and sore throat.    Eyes: Negative for blurred vision, photophobia  and redness.   Respiratory: Negative for cough, hemoptysis, shortness of breath and wheezing.    Cardiovascular: Negative for chest pain, palpitations and orthopnea.   Gastrointestinal: Negative for abdominal pain, blood in stool, constipation, diarrhea, heartburn, nausea and vomiting.   Genitourinary: Negative for dysuria, frequency, hematuria and urgency.   Musculoskeletal: Negative for back pain and myalgias.   Skin: Negative for itching and rash.   Neurological: Negative for dizziness, speech change, focal weakness, weakness and headaches.   Endo/Heme/Allergies: Does not bruise/bleed easily.   Psychiatric/Behavioral: Negative for depression and suicidal ideas.            Physical Exam:    Visit Vitals   ??? BP 128/70 (BP 1 Location: Right arm, BP Patient Position: Sitting)   ??? Pulse 88   ??? Resp 18   ??? Ht 5\' 5"  (1.651 m)   ??? Wt 150 lb (68 kg)   ??? BMI 24.96 kg/m2      Physical Examination: General appearance - alert, well appearing, and in no distress  Mental status - alert, oriented to person, place, and time  Eyes - sclera anicteric, left eye normal, right eye normal  Ears - right ear normal, left ear normal   Nose - normal and patent, no erythema, discharge or polyps  Mouth - mucous membranes moist, pharynx normal without lesions  Extremities - Left forearm graft with good thrill.  No significant edema.      Impression and Plan:    ICD-10-CM ICD-9-CM    1. ESRD on hemodialysis (HCC) N18.6 585.6     Z99.2 V45.11      I told Tracey King that she is healing well after her procedure. She is only 2 weeks out from her surgery so it is too soon to use her graft. If there are no issues next week, then she can use the graft Saturday the 11th.    Follow-up Disposition:  Return in about 2 weeks (around 03/20/2016).      The treatment plan was reviewed with the patient in detail.  The patient voiced understanding of this plan and all questions and concerns were addressed.  The patient agrees with this plan.  We discussed the signs and symptoms that would require earlier attention or intervention.     The patient was given educational material related to his/her visit and the patient has voiced understanding of the material.     I appreciate the opportunity to participate in the care of your patient.  I will be sure to keep you informed of any subsequent changes in the treatment plan.  If you have any questions or concerns, please feel free to contact me.  Ouida SillsJason L Wylan Gentzler, MD    PLEASE NOTE:  This document has been produced using voice recognition software. Unrecognized errors in transcription may be present.

## 2016-03-12 ENCOUNTER — Ambulatory Visit: Admit: 2016-03-12 | Discharge: 2016-03-12 | Payer: MEDICARE | Attending: Vascular Surgery | Primary: Internal Medicine

## 2016-03-12 DIAGNOSIS — N189 Chronic kidney disease, unspecified: Secondary | ICD-10-CM

## 2016-03-12 MED ORDER — LIDOCAINE-PRILOCAINE 2.5 %-2.5 % TOPICAL CREAM
CUTANEOUS | 3 refills | Status: DC
Start: 2016-03-12 — End: 2016-08-08

## 2016-03-12 NOTE — Progress Notes (Signed)
Order for emla cream topical , use small amount on left forearm graft  30 min beofre dialysis #1/3 placed per Verbal order from Dr. Debby Budandre . Pt verbalized understanding .

## 2016-03-12 NOTE — Progress Notes (Signed)
Tracey King    Chief Complaint   Patient presents with   ??? End Stage Renal Disease       History and Physical    Tracey King returns to our office for follow up after undergoing a left forearm loop AV graft.  She has no new complaints.      Past Medical History:   Diagnosis Date   ??? Arrhythmia     intermittent "racing heart"   ??? Chronic kidney disease     ESRD, Stage IV   ??? Chronic obstructive pulmonary disease (HCC)    ??? GERD (gastroesophageal reflux disease)    ??? Hypertension    ??? Rheumatic fever 1970's   ??? Sarcoidosis      Patient Active Problem List   Diagnosis Code   ??? Chronic kidney disease N18.9     Past Surgical History:   Procedure Laterality Date   ??? HX LAP CHOLECYSTECTOMY     ??? HX TAH AND BSO       Current Outpatient Prescriptions   Medication Sig Dispense Refill   ??? lidocaine-prilocaine (EMLA) topical cream Place small amount on left arm graft 30 min before dialysis 30 g 3   ??? oxyCODONE-acetaminophen (PERCOCET 7.5) 7.5-325 mg per tablet Take 2 Tabs by mouth every six (6) hours as needed for Pain. Max Daily Amount: 8 Tabs. 42 Tab 0   ??? albuterol (PROVENTIL HFA, VENTOLIN HFA, PROAIR HFA) 90 mcg/actuation inhaler Take 2 Puffs by inhalation every four (4) hours as needed for Wheezing.     ??? cholecalciferol (VITAMIN D3) 1,000 unit tablet Take 2,000 Units by mouth daily.     ??? tiotropium bromide (SPIRIVA RESPIMAT) 2.5 mcg/actuation inhaler Take 2 Puffs by inhalation daily.     ??? aspirin 81 mg chewable tablet Take 81 mg by mouth daily.     ??? calcitRIOL (ROCALTROL) 0.25 mcg capsule Take 0.25 mcg by mouth daily.     ??? colchicine 0.6 mg tablet Take 0.6 mg by mouth daily.     ??? fluticasone-salmeterol (ADVAIR) 100-50 mcg/dose diskus inhaler Take 1 Puff by inhalation every twelve (12) hours.     ??? furosemide (LASIX) 20 mg tablet Take  by mouth daily.     ??? metoprolol succinate (TOPROL-XL) 25 mg XL tablet Take  by mouth daily.     ??? Polyethylene Glycol 3350 powd by Does Not Apply route.      ??? amLODIPine (NORVASC) 10 mg tablet Take  by mouth daily.       Allergies   Allergen Reactions   ??? Erythromycin Nausea and Vomiting   ??? Reglan [Metoclopramide Hcl] Anxiety     Social History     Social History   ??? Marital status: MARRIED     Spouse name: N/A   ??? Number of children: N/A   ??? Years of education: N/A     Occupational History   ??? Not on file.     Social History Main Topics   ??? Smoking status: Former Smoker     Quit date: 05/06/1968   ??? Smokeless tobacco: Never Used   ??? Alcohol use No   ??? Drug use: No   ??? Sexual activity: No     Other Topics Concern   ??? Not on file     Social History Narrative      History reviewed. No pertinent family history.    Review of Systems    Review of Systems   Constitutional: Negative for chills, diaphoresis,  fever, malaise/fatigue and weight loss.   HENT: Negative for hearing loss and sore throat.    Eyes: Negative for blurred vision, photophobia and redness.   Respiratory: Negative for cough, hemoptysis, shortness of breath and wheezing.    Cardiovascular: Negative for chest pain, palpitations and orthopnea.   Gastrointestinal: Negative for abdominal pain, blood in stool, constipation, diarrhea, heartburn, nausea and vomiting.   Genitourinary: Negative for dysuria, frequency, hematuria and urgency.   Musculoskeletal: Negative for back pain and myalgias.   Skin: Negative for itching and rash.   Neurological: Negative for dizziness, speech change, focal weakness, weakness and headaches.   Endo/Heme/Allergies: Does not bruise/bleed easily.   Psychiatric/Behavioral: Negative for depression and suicidal ideas.            Physical Exam:    Visit Vitals   ??? BP 130/74 (BP 1 Location: Right arm, BP Patient Position: Sitting)   ??? Pulse 80   ??? Resp 18   ??? Ht 5\' 5"  (1.651 m)   ??? Wt 150 lb (68 kg)   ??? BMI 24.96 kg/m2      Physical Examination: General appearance - alert, well appearing, and in no distress  Mental status - alert, oriented to person, place, and time   Eyes - sclera anicteric, left eye normal, right eye normal  Ears - right ear normal, left ear normal  Nose - normal and patent, no erythema, discharge or polyps  Mouth - mucous membranes moist, pharynx normal without lesions  Extremities - Warm and well perfused.  Left forearm incisions well healed.  No significant edema.  Graft with good thrill.  No signs of infection.        Impression and Plan:    ICD-10-CM ICD-9-CM    1. Chronic kidney disease, unspecified CKD stage N18.9 585.9      Orders Placed This Encounter   ??? lidocaine-prilocaine (EMLA) topical cream     I told Tracey King that I am happy with how well she has healed from her surgery.  I have cleared her graft for access at her next dialysis.  We will check on her again after her graft has been used in order to monitor her for any signs of stenosis.     Follow-up Disposition:  Return in about 2 weeks (around 03/26/2016).      The treatment plan was reviewed with the patient in detail.  The patient voiced understanding of this plan and all questions and concerns were addressed.  The patient agrees with this plan.  We discussed the signs and symptoms that would require earlier attention or intervention.     The patient was given educational material related to his/her visit and the patient has voiced understanding of the material.     I appreciate the opportunity to participate in the care of your patient.  I will be sure to keep you informed of any subsequent changes in the treatment plan.  If you have any questions or concerns, please feel free to contact me.  Ouida SillsJason L Naftula Donahue, MD    PLEASE NOTE:  This document has been produced using voice recognition software. Unrecognized errors in transcription may be present.

## 2016-03-13 ENCOUNTER — Inpatient Hospital Stay: Admit: 2016-03-13 | Payer: MEDICARE | Attending: Internal Medicine | Primary: Internal Medicine

## 2016-03-13 ENCOUNTER — Encounter: Attending: Vascular Surgery | Primary: Internal Medicine

## 2016-03-13 ENCOUNTER — Ambulatory Visit: Payer: BLUE CROSS/BLUE SHIELD | Primary: Internal Medicine

## 2016-03-13 ENCOUNTER — Encounter: Payer: BLUE CROSS/BLUE SHIELD | Primary: Internal Medicine

## 2016-03-13 DIAGNOSIS — Z01810 Encounter for preprocedural cardiovascular examination: Secondary | ICD-10-CM

## 2016-03-13 DIAGNOSIS — Z01818 Encounter for other preprocedural examination: Secondary | ICD-10-CM

## 2016-03-13 LAB — NUCLEAR STRESS TEST
Duke treadmill score: -4
ECG Interp. Before Exercise: ABNORMAL
Max. Diastolic BP: 102 mmHg
Max. Heart rate: 121 {beats}/min
Max. Systolic BP: 205 mmHg
Peak Ex METs: 1 METS

## 2016-03-13 LAB — NM MYOCARDIAL SPECT REST EXERCISE OR RX: Left Ventricular Ejection Fraction: 70

## 2016-03-13 MED ORDER — REGADENOSON 0.4 MG/5 ML IV SYRINGE
0.4 mg/5 mL | Freq: Once | INTRAVENOUS | Status: AC
Start: 2016-03-13 — End: 2016-03-13

## 2016-03-13 MED ORDER — REGADENOSON 0.4 MG/5 ML IV SYRINGE
0.4 mg/5 mL | INTRAVENOUS | Status: AC
Start: 2016-03-13 — End: 2016-03-13
  Administered 2016-03-13: 16:00:00

## 2016-03-13 MED ORDER — TECHNETIUM TC 99M SESTAMIBI - CARDIOLITE
Freq: Once | Status: AC
Start: 2016-03-13 — End: 2016-03-13
  Administered 2016-03-13: 14:00:00 via INTRAVENOUS

## 2016-03-13 MED ORDER — TECHNETIUM TC 99M SESTAMIBI - CARDIOLITE
Freq: Once | Status: AC
Start: 2016-03-13 — End: 2016-03-13
  Administered 2016-03-13: 16:00:00 via INTRAVENOUS

## 2016-03-13 MED FILL — LEXISCAN 0.4 MG/5 ML INTRAVENOUS SYRINGE: 0.4 mg/5 mL | INTRAVENOUS | Qty: 5

## 2016-04-03 ENCOUNTER — Ambulatory Visit: Admit: 2016-04-03 | Discharge: 2016-04-03 | Payer: MEDICARE | Attending: Vascular Surgery | Primary: Internal Medicine

## 2016-04-03 DIAGNOSIS — N186 End stage renal disease: Secondary | ICD-10-CM

## 2016-04-03 NOTE — Progress Notes (Signed)
Tracey King    Chief Complaint   Patient presents with   ??? End Stage Renal Disease       History and Physical    Tracey King returns to our office for follow up of her long term dialysis access.  She states that yesterday her daughter accessed her for dialysis and her arm began to swell.  She developed some pain in her forearm near her elbow after this.  She has no other complaints.     Past Medical History:   Diagnosis Date   ??? Arrhythmia     intermittent "racing heart"   ??? Chronic kidney disease     ESRD, Stage IV   ??? Chronic obstructive pulmonary disease (HCC)    ??? GERD (gastroesophageal reflux disease)    ??? Hypertension    ??? Rheumatic fever 1970's   ??? Sarcoidosis      Patient Active Problem List   Diagnosis Code   ??? Chronic kidney disease N18.9   ??? ESRD on hemodialysis (HCC) N18.6, Z99.2     Past Surgical History:   Procedure Laterality Date   ??? HX LAP CHOLECYSTECTOMY     ??? HX TAH AND BSO       Current Outpatient Prescriptions   Medication Sig Dispense Refill   ??? lidocaine-prilocaine (EMLA) topical cream Place small amount on left arm graft 30 min before dialysis 30 g 3   ??? oxyCODONE-acetaminophen (PERCOCET 7.5) 7.5-325 mg per tablet Take 2 Tabs by mouth every six (6) hours as needed for Pain. Max Daily Amount: 8 Tabs. 42 Tab 0   ??? albuterol (PROVENTIL HFA, VENTOLIN HFA, PROAIR HFA) 90 mcg/actuation inhaler Take 2 Puffs by inhalation every four (4) hours as needed for Wheezing.     ??? cholecalciferol (VITAMIN D3) 1,000 unit tablet Take 2,000 Units by mouth daily.     ??? tiotropium bromide (SPIRIVA RESPIMAT) 2.5 mcg/actuation inhaler Take 2 Puffs by inhalation daily.     ??? aspirin 81 mg chewable tablet Take 81 mg by mouth daily.     ??? calcitRIOL (ROCALTROL) 0.25 mcg capsule Take 0.25 mcg by mouth daily.     ??? colchicine 0.6 mg tablet Take 0.6 mg by mouth daily.     ??? fluticasone-salmeterol (ADVAIR) 100-50 mcg/dose diskus inhaler Take 1 Puff by inhalation every twelve (12) hours.      ??? furosemide (LASIX) 20 mg tablet Take  by mouth daily.     ??? metoprolol succinate (TOPROL-XL) 25 mg XL tablet Take  by mouth daily.     ??? Polyethylene Glycol 3350 powd by Does Not Apply route.     ??? amLODIPine (NORVASC) 10 mg tablet Take  by mouth daily.       Allergies   Allergen Reactions   ??? Erythromycin Nausea and Vomiting   ??? Reglan [Metoclopramide Hcl] Anxiety     Social History     Social History   ??? Marital status: MARRIED     Spouse name: N/A   ??? Number of children: N/A   ??? Years of education: N/A     Occupational History   ??? Not on file.     Social History Main Topics   ??? Smoking status: Former Smoker     Quit date: 05/06/1968   ??? Smokeless tobacco: Never Used   ??? Alcohol use No   ??? Drug use: No   ??? Sexual activity: No     Other Topics Concern   ??? Not on file  Social History Narrative      Family History   Problem Relation Age of Onset   ??? Heart Attack Father        Review of Systems    Review of Systems   Constitutional: Negative for chills, diaphoresis, fever, malaise/fatigue and weight loss.   HENT: Negative for hearing loss and sore throat.    Eyes: Negative for blurred vision, photophobia and redness.   Respiratory: Negative for cough, hemoptysis, shortness of breath and wheezing.    Cardiovascular: Negative for chest pain, palpitations and orthopnea.   Gastrointestinal: Negative for abdominal pain, blood in stool, constipation, diarrhea, heartburn, nausea and vomiting.   Genitourinary: Negative for dysuria, frequency, hematuria and urgency.   Musculoskeletal: Negative for back pain and myalgias.   Skin: Negative for itching and rash.   Neurological: Negative for dizziness, speech change, focal weakness, weakness and headaches.   Endo/Heme/Allergies: Does not bruise/bleed easily.   Psychiatric/Behavioral: Negative for depression and suicidal ideas.            Physical Exam:    Visit Vitals   ??? BP (!) 188/94 (BP 1 Location: Right arm, BP Patient Position: Sitting)   ??? Pulse 88   ??? Resp 18    ??? Ht 5\' 5"  (1.651 m)   ??? Wt 150 lb (68 kg)   ??? BMI 24.96 kg/m2      Physical Examination: General appearance - alert, well appearing, and in no distress  Mental status - alert, oriented to person, place, and time  Eyes - sclera anicteric, left eye normal, right eye normal  Ears - right ear normal, left ear normal  Nose - normal and patent, no erythema, discharge or polyps  Mouth - mucous membranes moist, pharynx normal without lesions  Extremities - Left forearm graft with good thrill.  Hematoma along left medial forearm.  Nonpulsatile    Impression and Plan:    ICD-10-CM ICD-9-CM    1. ESRD on hemodialysis (HCC) N18.6 585.6     Z99.2 V45.11      I told Tracey King that it appears that she infiltrated her dialysis access.  I would like her to rest her access for the next few days and I will re evaluate it next Tuesday.  After this she will hopefully access her graft again and perhaps we can remove her catheter next Friday.      Follow-up Disposition:  Return in about 1 week (around 04/10/2016).      The treatment plan was reviewed with the patient in detail.  The patient voiced understanding of this plan and all questions and concerns were addressed.  The patient agrees with this plan.  We discussed the signs and symptoms that would require earlier attention or intervention.     The patient was given educational material related to his/her visit and the patient has voiced understanding of the material.     I appreciate the opportunity to participate in the care of your patient.  I will be sure to keep you informed of any subsequent changes in the treatment plan.  If you have any questions or concerns, please feel free to contact me.  Ouida SillsJason L Mizuki Hoel, MD    PLEASE NOTE:  This document has been produced using voice recognition software. Unrecognized errors in transcription may be present.

## 2016-04-09 ENCOUNTER — Encounter: Attending: Vascular Surgery | Primary: Internal Medicine

## 2016-04-09 ENCOUNTER — Ambulatory Visit: Admit: 2016-04-09 | Discharge: 2016-04-09 | Payer: MEDICARE | Attending: Vascular Surgery | Primary: Internal Medicine

## 2016-04-09 DIAGNOSIS — N186 End stage renal disease: Secondary | ICD-10-CM

## 2016-04-09 NOTE — Progress Notes (Signed)
Tracey King    Chief Complaint   Patient presents with   ??? End Stage Renal Disease       History and Physical    [name] returns to our office due to infiltrating her graft last week.  She states her pain has improved since last week.  She has no new complaints.      Past Medical History:   Diagnosis Date   ??? Arrhythmia     intermittent "racing heart"   ??? Chronic kidney disease     ESRD, Stage IV   ??? Chronic obstructive pulmonary disease (HCC)    ??? GERD (gastroesophageal reflux disease)    ??? Hypertension    ??? Rheumatic fever 1970's   ??? Sarcoidosis      Patient Active Problem List   Diagnosis Code   ??? Chronic kidney disease N18.9   ??? ESRD on hemodialysis (HCC) N18.6, Z99.2     Past Surgical History:   Procedure Laterality Date   ??? HX LAP CHOLECYSTECTOMY     ??? HX TAH AND BSO       Current Outpatient Prescriptions   Medication Sig Dispense Refill   ??? lidocaine-prilocaine (EMLA) topical cream Place small amount on left arm graft 30 min before dialysis 30 g 3   ??? oxyCODONE-acetaminophen (PERCOCET 7.5) 7.5-325 mg per tablet Take 2 Tabs by mouth every six (6) hours as needed for Pain. Max Daily Amount: 8 Tabs. 42 Tab 0   ??? albuterol (PROVENTIL HFA, VENTOLIN HFA, PROAIR HFA) 90 mcg/actuation inhaler Take 2 Puffs by inhalation every four (4) hours as needed for Wheezing.     ??? cholecalciferol (VITAMIN D3) 1,000 unit tablet Take 2,000 Units by mouth daily.     ??? tiotropium bromide (SPIRIVA RESPIMAT) 2.5 mcg/actuation inhaler Take 2 Puffs by inhalation daily.     ??? aspirin 81 mg chewable tablet Take 81 mg by mouth daily.     ??? calcitRIOL (ROCALTROL) 0.25 mcg capsule Take 0.25 mcg by mouth daily.     ??? colchicine 0.6 mg tablet Take 0.6 mg by mouth daily.     ??? fluticasone-salmeterol (ADVAIR) 100-50 mcg/dose diskus inhaler Take 1 Puff by inhalation every twelve (12) hours.     ??? furosemide (LASIX) 20 mg tablet Take  by mouth daily.     ??? metoprolol succinate (TOPROL-XL) 25 mg XL tablet Take  by mouth daily.      ??? Polyethylene Glycol 3350 powd by Does Not Apply route.     ??? amLODIPine (NORVASC) 10 mg tablet Take  by mouth daily.       Allergies   Allergen Reactions   ??? Erythromycin Nausea and Vomiting   ??? Reglan [Metoclopramide Hcl] Anxiety     Social History     Social History   ??? Marital status: MARRIED     Spouse name: N/A   ??? Number of children: N/A   ??? Years of education: N/A     Occupational History   ??? Not on file.     Social History Main Topics   ??? Smoking status: Former Smoker     Quit date: 05/06/1968   ??? Smokeless tobacco: Never Used   ??? Alcohol use No   ??? Drug use: No   ??? Sexual activity: No     Other Topics Concern   ??? Not on file     Social History Narrative      Family History   Problem Relation Age of Onset   ???  Heart Attack Father        Review of Systems    Review of Systems   Constitutional: Negative for chills, diaphoresis, fever, malaise/fatigue and weight loss.   HENT: Negative for hearing loss and sore throat.    Eyes: Negative for blurred vision, photophobia and redness.   Respiratory: Negative for cough, hemoptysis, shortness of breath and wheezing.    Cardiovascular: Negative for chest pain, palpitations and orthopnea.   Gastrointestinal: Negative for abdominal pain, blood in stool, constipation, diarrhea, heartburn, nausea and vomiting.   Genitourinary: Negative for dysuria, frequency, hematuria and urgency.   Musculoskeletal: Negative for back pain and myalgias.   Skin: Negative for itching and rash.   Neurological: Negative for dizziness, speech change, focal weakness, weakness and headaches.   Endo/Heme/Allergies: Does not bruise/bleed easily.   Psychiatric/Behavioral: Negative for depression and suicidal ideas.            Physical Exam:    Visit Vitals   ??? BP 140/80 (BP 1 Location: Left arm, BP Patient Position: Sitting)   ??? Pulse 80   ??? Resp 18   ??? Ht 5\' 5"  (1.651 m)   ??? Wt 150 lb (68 kg)   ??? BMI 24.96 kg/m2      Physical Examination: General appearance - alert, well appearing, and in  no distress  Mental status - alert, oriented to person, place, and time  Eyes - sclera anicteric, left eye normal, right eye normal  Ears - right ear normal, left ear normal  Nose - normal and patent, no erythema, discharge or polyps  Mouth - mucous membranes moist, pharynx normal without lesions  Extremities - Left forearm graft with ecchymosis.  Non tender to palpation.  Good thrill.        Impression and Plan:    ICD-10-CM ICD-9-CM    1. ESRD on hemodialysis (HCC) N18.6 585.6     Z99.2 V45.11      I am going to give [name] one additional week to rest her graft.  She will continue to use her catheter until I see her again next week to reassess it.     The treatment plan was reviewed with the patient in detail.  The patient voiced understanding of this plan and all questions and concerns were addressed.  The patient agrees with this plan.  We discussed the signs and symptoms that would require earlier attention or intervention.     The patient was given educational material related to his/her visit and the patient has voiced understanding of the material.     I appreciate the opportunity to participate in the care of your patient.  I will be sure to keep you informed of any subsequent changes in the treatment plan.  If you have any questions or concerns, please feel free to contact me.  Ouida SillsJason L Maxen Rowland, MD    PLEASE NOTE:  This document has been produced using voice recognition software. Unrecognized errors in transcription may be present.

## 2016-04-17 ENCOUNTER — Encounter: Attending: Family | Primary: Internal Medicine

## 2016-04-17 ENCOUNTER — Encounter: Attending: Vascular Surgery | Primary: Internal Medicine

## 2016-04-23 ENCOUNTER — Ambulatory Visit: Admit: 2016-04-23 | Discharge: 2016-04-23 | Payer: MEDICARE | Attending: Family | Primary: Internal Medicine

## 2016-04-23 DIAGNOSIS — N186 End stage renal disease: Secondary | ICD-10-CM

## 2016-04-23 NOTE — Progress Notes (Signed)
Tracey King    Chief Complaint   Patient presents with   ??? End Stage Renal Disease       History and Physical    Tracey King is a 65 year old female who returns to our office for continued management of her left forearm AV graft.  She states the previous infiltration of the graft has resolved and she has had no problems with the graft.  She has been using her catheter for home dialysis while the infiltration resolved and she has not yet begun to use the graft again.    Past Medical History:   Diagnosis Date   ??? Arrhythmia     intermittent "racing heart"   ??? Chronic kidney disease     ESRD, Stage IV   ??? Chronic obstructive pulmonary disease (HCC)    ??? GERD (gastroesophageal reflux disease)    ??? Hypertension    ??? Rheumatic fever 1970's   ??? Sarcoidosis      Patient Active Problem List   Diagnosis Code   ??? Chronic kidney disease N18.9   ??? ESRD on hemodialysis (HCC) N18.6, Z99.2     Past Surgical History:   Procedure Laterality Date   ??? HX LAP CHOLECYSTECTOMY     ??? HX TAH AND BSO       Current Outpatient Prescriptions   Medication Sig Dispense Refill   ??? lidocaine-prilocaine (EMLA) topical cream Place small amount on left arm graft 30 min before dialysis 30 g 3   ??? oxyCODONE-acetaminophen (PERCOCET 7.5) 7.5-325 mg per tablet Take 2 Tabs by mouth every six (6) hours as needed for Pain. Max Daily Amount: 8 Tabs. 42 Tab 0   ??? albuterol (PROVENTIL HFA, VENTOLIN HFA, PROAIR HFA) 90 mcg/actuation inhaler Take 2 Puffs by inhalation every four (4) hours as needed for Wheezing.     ??? cholecalciferol (VITAMIN D3) 1,000 unit tablet Take 2,000 Units by mouth daily.     ??? tiotropium bromide (SPIRIVA RESPIMAT) 2.5 mcg/actuation inhaler Take 2 Puffs by inhalation daily.     ??? aspirin 81 mg chewable tablet Take 81 mg by mouth daily.     ??? calcitRIOL (ROCALTROL) 0.25 mcg capsule Take 0.25 mcg by mouth daily.     ??? colchicine 0.6 mg tablet Take 0.6 mg by mouth daily.      ??? fluticasone-salmeterol (ADVAIR) 100-50 mcg/dose diskus inhaler Take 1 Puff by inhalation every twelve (12) hours.     ??? furosemide (LASIX) 20 mg tablet Take  by mouth daily.     ??? metoprolol succinate (TOPROL-XL) 25 mg XL tablet Take  by mouth daily.     ??? Polyethylene Glycol 3350 powd by Does Not Apply route.     ??? amLODIPine (NORVASC) 10 mg tablet Take  by mouth daily.       Allergies   Allergen Reactions   ??? Erythromycin Nausea and Vomiting   ??? Reglan [Metoclopramide Hcl] Anxiety     Social History     Social History   ??? Marital status: MARRIED     Spouse name: N/A   ??? Number of children: N/A   ??? Years of education: N/A     Occupational History   ??? Not on file.     Social History Main Topics   ??? Smoking status: Former Smoker     Quit date: 05/06/1968   ??? Smokeless tobacco: Never Used   ??? Alcohol use No   ??? Drug use: No   ??? Sexual activity:  No     Other Topics Concern   ??? Not on file     Social History Narrative      Family History   Problem Relation Age of Onset   ??? Heart Attack Father        Review of Systems    ROS         Physical Exam:    Visit Vitals   ??? BP 136/72 (BP 1 Location: Right arm, BP Patient Position: Sitting)   ??? Pulse 76   ??? Resp 18   ??? Ht 5\' 5"  (1.651 m)   ??? Wt 150 lb (68 kg)   ??? BMI 24.96 kg/m2      Physical Examination: General appearance - alert, well appearing, and in no distress  Mental status - alert, oriented to person, place, and time, normal mood, behavior, speech, dress, motor activity, and thought processes  Eyes - left eye normal, right eye normal  Ears - right ear normal, left ear normal  Mouth - mucous membranes moist  Chest - clear to auscultation, no wheezes, TDC right upper chest with dressing CDI  Heart - normal rate and regular rhythm  Musculoskeletal - no muscular tenderness noted  Extremities - peripheral pulses normal, no pedal edema, no clubbing or cyanosis, left forearm AV graft +bruit and +thrill  Skin - normal coloration and turgor, no rashes, no suspicious skin lesions  noted      Impression and Plan:    ICD-10-CM ICD-9-CM    1. ESRD on hemodialysis (HCC) N18.6 585.6     Z99.2 V45.11      No orders of the defined types were placed in this encounter.    Tracey King's graft infiltration has resolved and I advised her to begin using her graft for dialysis again.  We will see her again in two weeks [facility] with no problems using her graft.    Follow-up Disposition:  Return in about 2 weeks (around 05/07/2016).      The treatment plan was reviewed with the patient in detail.  The patient voiced understanding of this plan and all questions and concerns were addressed.  The patient agrees with this plan.  We discussed the signs and symptoms that would require earlier attention or intervention.     The patient was given educational material related to his/her visit and the patient has voiced understanding of the material.     I appreciate the opportunity to participate in the care of your patient.  I will be sure to keep you informed of any subsequent changes in the treatment plan.  If you have any questions or concerns, please feel free to contact me.  Roselyn BeringAshley N Holloway, NP    PLEASE NOTE:  This document has been produced using voice recognition software. Unrecognized errors in transcription may be present.

## 2016-05-08 ENCOUNTER — Ambulatory Visit: Admit: 2016-05-08 | Discharge: 2016-05-08 | Payer: MEDICARE | Attending: Vascular Surgery | Primary: Internal Medicine

## 2016-05-08 DIAGNOSIS — N186 End stage renal disease: Secondary | ICD-10-CM

## 2016-05-08 NOTE — Progress Notes (Signed)
A user error has taken place: patient not seen

## 2016-05-22 ENCOUNTER — Ambulatory Visit: Admit: 2016-05-22 | Payer: MEDICARE | Attending: Vascular Surgery | Primary: Internal Medicine

## 2016-05-22 DIAGNOSIS — N186 End stage renal disease: Secondary | ICD-10-CM

## 2016-05-22 NOTE — Progress Notes (Signed)
History of Present Illness: Tracey King is here today for a follow-up on her end stage renal disease.      Procedure Note: the skin was cleansed with Betadine solution.  The anchoring sutures were removed.  Then, approximately 7 cc of 1% Lidocaine were injected around the catheter site.  Under blunt dissection, the cuff of the catheter was freed from the skin edges.  The catheter was removed in it's entirety with the cuff intact.  Direct pressure was applied for approximately 10 minutes followed by pressure dressing.  She tolerated the procedure well.    Assessment/Plan:  She was given follow-up care instructions and told to call with any problems, questions or concerns.    Ouida SillsJason L Man Effertz, MD

## 2016-05-22 NOTE — Patient Instructions (Signed)
Post Catheter Removal Instructions    1.   Remove bandage the following day.  2.  You are allowed to shower the following day after your procedure  3.  Keep the area clean and covered until scabbed.  4.  If bleeding occurs, please call the office.  5.  Use an ice pack for discomfort.  6.  No heavy lifting or straining for 24 hours after removal.  7.  If on blood thinner - you can resume taking your medication in 24 hours

## 2016-08-06 ENCOUNTER — Inpatient Hospital Stay: Admit: 2016-08-06 | Payer: MEDICARE | Attending: Cardiovascular Disease | Primary: Internal Medicine

## 2016-08-06 LAB — PROTHROMBIN TIME + INR
INR: 0.9 (ref 0.8–1.2)
Prothrombin time: 12.1 s (ref 11.5–15.2)

## 2016-08-06 LAB — CBC W/O DIFF
HCT: 30.1 % — ABNORMAL LOW (ref 35.0–45.0)
HGB: 9.7 g/dL — ABNORMAL LOW (ref 12.0–16.0)
MCH: 28.3 PG (ref 24.0–34.0)
MCHC: 32.2 g/dL (ref 31.0–37.0)
MCV: 87.8 FL (ref 74.0–97.0)
MPV: 9.1 FL — ABNORMAL LOW (ref 9.2–11.8)
PLATELET: 163 10*3/uL (ref 135–420)
RBC: 3.43 M/uL — ABNORMAL LOW (ref 4.20–5.30)
RDW: 15.5 % — ABNORMAL HIGH (ref 11.6–14.5)
WBC: 8 10*3/uL (ref 4.6–13.2)

## 2016-08-06 MED ORDER — SODIUM CHLORIDE 0.9 % IJ SYRG
INTRAMUSCULAR | Status: DC | PRN
Start: 2016-08-06 — End: 2016-08-06

## 2016-08-06 MED ORDER — SODIUM CHLORIDE 0.9 % IJ SYRG
Freq: Three times a day (TID) | INTRAMUSCULAR | Status: DC
Start: 2016-08-06 — End: 2016-08-06

## 2016-08-06 MED ORDER — HEPARIN (PORCINE) 1,000 UNIT/ML IJ SOLN
1000 unit/mL | INTRAMUSCULAR | Status: DC | PRN
Start: 2016-08-06 — End: 2016-08-06

## 2016-08-06 MED ORDER — FENTANYL CITRATE (PF) 50 MCG/ML IJ SOLN
50 mcg/mL | INTRAMUSCULAR | Status: AC
Start: 2016-08-06 — End: 2016-08-06
  Administered 2016-08-06: 16:00:00 via INTRAVENOUS

## 2016-08-06 MED ORDER — LABETALOL 5 MG/ML IV SYRINGE
20 mg/4 mL (5 mg/mL) | INTRAVENOUS | Status: AC
Start: 2016-08-06 — End: 2016-08-06
  Administered 2016-08-06: 16:00:00 via INTRAVENOUS

## 2016-08-06 MED ORDER — MIDAZOLAM 1 MG/ML IJ SOLN
1 mg/mL | INTRAMUSCULAR | Status: DC | PRN
Start: 2016-08-06 — End: 2016-08-06
  Administered 2016-08-06: 16:00:00 via INTRAVENOUS

## 2016-08-06 MED ORDER — EPTIFIBATIDE 2 MG/ML IV SOLN
2 mg/mL | INTRAVENOUS | Status: DC | PRN
Start: 2016-08-06 — End: 2016-08-06
  Administered 2016-08-06: 14:00:00 via INTRAVENOUS

## 2016-08-06 MED ORDER — NITROGLYCERIN 0.1 MG/ML (100 MCG/ML) D5W COMPOUNDED INJECTION
0.1 mg/mL | INTRAVENOUS | Status: DC | PRN
Start: 2016-08-06 — End: 2016-08-06
  Administered 2016-08-06: 16:00:00 via INTRA_ARTERIAL

## 2016-08-06 MED ORDER — NITROGLYCERIN 0.1 MG/ML (100 MCG/ML) D5W COMPOUNDED INJECTION
0.1 mg/mL | INTRAVENOUS | Status: DC | PRN
Start: 2016-08-06 — End: 2016-08-06

## 2016-08-06 MED ORDER — MIDAZOLAM 1 MG/ML IJ SOLN
1 mg/mL | INTRAMUSCULAR | Status: AC
Start: 2016-08-06 — End: 2016-08-06
  Administered 2016-08-06: 16:00:00 via INTRAVENOUS

## 2016-08-06 MED ORDER — LIDOCAINE (PF) 10 MG/ML (1 %) IJ SOLN
10 mg/mL (1 %) | Freq: Once | INTRAMUSCULAR | Status: AC
Start: 2016-08-06 — End: 2016-08-06

## 2016-08-06 MED ORDER — LIDOCAINE (PF) 10 MG/ML (1 %) IJ SOLN
10 mg/mL (1 %) | INTRAMUSCULAR | Status: AC
Start: 2016-08-06 — End: 2016-08-06
  Administered 2016-08-06: 16:00:00 via SUBCUTANEOUS

## 2016-08-06 MED ORDER — HEPARIN (PORCINE) 1,000 UNIT/ML IJ SOLN
1000 unit/mL | INTRAMUSCULAR | Status: AC
Start: 2016-08-06 — End: 2016-08-06
  Administered 2016-08-06: 16:00:00 via INTRAVENOUS

## 2016-08-06 MED ORDER — HEPARIN (PORCINE) IN NS (PF) 1,000 UNIT/500 ML IV
1000 unit/500 mL | INTRAVENOUS | Status: AC
Start: 2016-08-06 — End: 2016-08-06
  Administered 2016-08-06: 15:00:00 via INTRA_ARTERIAL

## 2016-08-06 MED ORDER — FENTANYL CITRATE (PF) 50 MCG/ML IJ SOLN
50 mcg/mL | INTRAMUSCULAR | Status: DC | PRN
Start: 2016-08-06 — End: 2016-08-06
  Administered 2016-08-06: 16:00:00 via INTRAVENOUS

## 2016-08-06 MED ORDER — LABETALOL 5 MG/ML IV SOLN
5 mg/mL | Freq: Once | INTRAVENOUS | Status: AC
Start: 2016-08-06 — End: 2016-08-06
  Administered 2016-08-06: 19:00:00 via INTRAVENOUS

## 2016-08-06 MED ORDER — VERAPAMIL 2.5 MG/ML IV
2.5 mg/mL | INTRAVENOUS | Status: DC
Start: 2016-08-06 — End: 2016-08-06

## 2016-08-06 MED ORDER — ACETAMINOPHEN 325 MG TABLET
325 mg | Freq: Once | ORAL | Status: AC
Start: 2016-08-06 — End: 2016-08-06
  Administered 2016-08-06: 19:00:00 via ORAL

## 2016-08-06 MED ORDER — SODIUM CHLORIDE 0.9 % IV
INTRAVENOUS | Status: DC
Start: 2016-08-06 — End: 2016-08-06
  Administered 2016-08-06: 15:00:00 via INTRAVENOUS

## 2016-08-06 MED ORDER — LABETALOL 5 MG/ML IV SYRINGE
20 mg/4 mL (5 mg/mL) | INTRAVENOUS | Status: DC | PRN
Start: 2016-08-06 — End: 2016-08-06
  Administered 2016-08-06: 16:00:00 via INTRAVENOUS

## 2016-08-06 MED ORDER — HEPARIN (PORCINE) IN NS (PF) 1,000 UNIT/500 ML IV
1000 unit/500 mL | INTRAVENOUS | Status: DC | PRN
Start: 2016-08-06 — End: 2016-08-06
  Administered 2016-08-06: 15:00:00 via INTRA_ARTERIAL

## 2016-08-06 MED ORDER — IOPAMIDOL 61 % IV SOLN
300 mg iodine /mL (61 %) | INTRAVENOUS | Status: DC | PRN
Start: 2016-08-06 — End: 2016-08-06
  Administered 2016-08-06: 17:00:00

## 2016-08-06 MED ORDER — EPTIFIBATIDE 0.75 MG/ML IV SOLN
0.75 mg/mL | INTRAVENOUS | Status: DC
Start: 2016-08-06 — End: 2016-08-06
  Administered 2016-08-06: 14:00:00 via INTRAVENOUS

## 2016-08-06 MED FILL — NITROGLYCERIN 0.1 MG/ML (100 MCG/ML) D5W COMPOUNDED INJECTION: 0.1 mg/mL | INTRAVENOUS | Qty: 5

## 2016-08-06 MED FILL — ACETAMINOPHEN 325 MG TABLET: 325 mg | ORAL | Qty: 2

## 2016-08-06 MED FILL — MIDAZOLAM 1 MG/ML IJ SOLN: 1 mg/mL | INTRAMUSCULAR | Qty: 2

## 2016-08-06 MED FILL — HEPARIN (PORCINE) 1,000 UNIT/ML IJ SOLN: 1000 unit/mL | INTRAMUSCULAR | Qty: 10

## 2016-08-06 MED FILL — BD POSIFLUSH NORMAL SALINE 0.9 % INJECTION SYRINGE: INTRAMUSCULAR | Qty: 10

## 2016-08-06 MED FILL — LIDOCAINE (PF) 10 MG/ML (1 %) IJ SOLN: 10 mg/mL (1 %) | INTRAMUSCULAR | Qty: 10

## 2016-08-06 MED FILL — HEPARIN (PORCINE) 1,000 UNIT/500 ML (2 UNIT/ML) IN 0.9 % NACL IV SOLN: 1000 unit/500 mL (2 unit/mL) | INTRAVENOUS | Qty: 1000

## 2016-08-06 MED FILL — LABETALOL 5 MG/ML IV SOLN: 5 mg/mL | INTRAVENOUS | Qty: 20

## 2016-08-06 MED FILL — VERAPAMIL 2.5 MG/ML IV: 2.5 mg/mL | INTRAVENOUS | Qty: 2

## 2016-08-06 MED FILL — ISOVUE-300  61 % INTRAVENOUS SOLUTION: 300 mg iodine /mL (61 %) | INTRAVENOUS | Qty: 150

## 2016-08-06 MED FILL — FENTANYL CITRATE (PF) 50 MCG/ML IJ SOLN: 50 mcg/mL | INTRAMUSCULAR | Qty: 2

## 2016-08-06 MED FILL — LABETALOL 5 MG/ML IV SYRINGE: 20 mg/4 mL (5 mg/mL) | INTRAVENOUS | Qty: 4

## 2016-08-06 MED FILL — SODIUM CHLORIDE 0.9 % IV: INTRAVENOUS | Qty: 1000

## 2016-08-06 NOTE — Discharge Summary (Signed)
Discharge Summary by Laveda Norman, MD at 08/06/16 1313                Author: Laveda Norman, MD  Service: Cardiology  Author Type: Physician       Filed: 08/06/16 1357  Date of Service: 08/06/16 1313  Status: Addendum          Editor: Laveda Norman, MD (Physician)          Related Notes: Original Note by Laveda Norman, MD (Physician) filed at 08/06/16 1316                     Discharge Summary          Patient: Tracey King  MRN: [id number]   CSN: [id number]          Date of Birth: 1951/03/23   Age: 66 y.o.   Sex: female      DOA: 08/06/2016  LOS:  LOS: 0 days    Discharge Date:         Primary Care Provider:  Delena Bali, MD      Admission Diagnoses: Angina class III, abnormal stress test, pre op for renal transplant       Discharge Diagnoses:  CAD      Problem List as of 08/06/2016   Date Reviewed:  2016/05/23                Codes  Class  Noted - Resolved             ESRD on hemodialysis [facility])  ICD-10-CM: N18.6, Z99.2   ICD-9-CM: 585.6, V45.11    04/03/2016 - Present                       Chronic kidney disease  ICD-10-CM: N18.9   ICD-9-CM: 585.9    12/26/2015 - Present                          Discharge Medications:        Current Discharge Medication List              CONTINUE these medications which have NOT CHANGED          Details        allopurinol (ZYLOPRIM) 100 mg tablet  Take  by mouth daily.               folic acid 1 mg tab 0.5 mg, multivitamin, stress formula tab 1 Tab  Take  by mouth daily.               isosorbide dinitrate (ISORDIL) 40 mg tablet  Take 10 mg by mouth two (2) times a day.               lidocaine-prilocaine (EMLA) topical cream  Place small amount on left arm graft 30 min before dialysis   Qty: 30 g, Refills:  3               albuterol (PROVENTIL HFA, VENTOLIN HFA, PROAIR HFA) 90 mcg/actuation inhaler  Take 2 Puffs by inhalation every four (4) hours as needed for Wheezing.               tiotropium bromide (SPIRIVA RESPIMAT) 2.5 mcg/actuation inhaler  Take 2 Puffs by  inhalation daily.               aspirin 81 mg chewable tablet  Take 81 mg by mouth daily.               calcitRIOL (ROCALTROL) 0.25 mcg capsule  Take 0.25 mcg by mouth daily.               colchicine 0.6 mg tablet  Take 0.6 mg by mouth daily.               fluticasone-salmeterol (ADVAIR) 100-50 mcg/dose diskus inhaler  Take 1 Puff by inhalation every twelve (12) hours.               metoprolol succinate (TOPROL-XL) 25 mg XL tablet  Take  by mouth daily.               Polyethylene Glycol 3350 powd  by Does Not Apply route.               amLODIPine (NORVASC) 10 mg tablet  Take  by mouth daily.               atorvastatin (LIPITOR) 10 mg tablet  Take 10 mg by mouth nightly.               Omeprazole delayed release (PRILOSEC D/R) 20 mg tablet  Take 20 mg by mouth daily.               traMADol (ULTRAM) 50 mg tablet  Take 50 mg by mouth every six (6) hours as needed for Pain.               oxyCODONE-acetaminophen (PERCOCET 7.5) 7.5-325 mg per tablet  Take 2 Tabs by mouth every six (6) hours as needed for Pain. Max Daily Amount: 8 Tabs.   Qty: 42 Tab, Refills:  0                         Discharge Condition: Stable      Procedures : LHC, RHC and coronary angiogram       Consults: None          PHYSICAL EXAM      Visit Vitals         ?  BP  159/84 (BP 1 Location: Right arm, BP Patient Position: At rest)     ?  Pulse  93     ?  Temp  98.4 ??F (36.9 ??C)     ?  Resp  16     ?  Ht   (1.676 m)     ?  Wt  63.5 kg (140 lb)     ?  Breastfeeding  No         ?  BMI  22.6 kg/m2        General: Awake, cooperative, no acute distress????   HEENT: NC, Atraumatic.  PERRLA, EOMI. Anicteric sclerae.   Lungs:  CTA Bilaterally. No Wheezing/Rhonchi/Rales.   Heart:  Regular  rhythm,?? No murmur, No Rubs, No Gallops   Abdomen: Soft, Non distended, Non tender. +Bowel sounds,    Extremities: No c/c/e   Psych:???? Not anxious or agitated.   Neurologic:?? No acute neurological deficits.                                         Admission HPI : see H/P       Hospital Course : Patient came for out patient LHC, RHC and  coronary angiogram. LHC, RHC and coronary angiogram  done via right radial and right anticubital vein.      LAD and LCx has mild to mdoearte CAD   RCA has luminal irregularities.   Patient tolerated procedure.   Medical management       Activity: No weight lifting not more than 5 lbs from right hand for 1 week, no driving for 24  hours       Diet: Cardiac       Follow-up: In cardiology clinic after 2 weeks      Disposition: Home      Minutes spent on discharge: 30 minutes            Labs:  Results:               Chemistry  No results for input(s): GLU, NA, K, CL, CO2, BUN, CREA, CA, AGAP, BUCR, TBIL, GPT, AP, TP, ALB, GLOB, AGRAT in the last 72 hours.        CBC w/Diff  Recent Labs          08/06/16    0903      WBC   8.0      RBC   3.43*      HGB   9.7*      HCT   30.1*      PLT   163                 Cardiac Enzymes  No results for input(s): CPK, CKND1, MYO in the last 72 hours.      No lab exists for component: CKRMB, TROIP        Coagulation  Recent Labs          08/06/16    0903      PTP   12.1      INR   0.9                  Lipid Panel  No results found for: CHOL, CHOLPOCT, CHOLX, CHLST, CHOLV, 884269, HDL, LDL, LDLC, DLDLP, 914782, VLDLC, VLDL, TGLX, TRIGL, TRIGP, TGLPOCT, CHHD, CHHDX     BNP  No results for input(s): BNPP in the last 72 hours.     Liver Enzymes  No results for input(s): TP, ALB, TBIL, AP, SGOT, GPT in the last 72 hours.      No lab exists for component: DBIL     Thyroid Studies  No results found for: T4, T3U, TSH, TSHEXT             Significant Diagnostic Studies: No results found.         No results found for this or any previous visit.            CC: Delena Bali, MD

## 2016-08-06 NOTE — Procedures (Signed)
LHC, RHC and coronary angiogram done via right radial and right anticubital vein.    LAD and LCx has mild to mdoearte CAD  RCA has luminal irregularities.  Patient tolerated procedure.  Medical management

## 2016-08-06 NOTE — Progress Notes (Signed)
Lunch given

## 2016-08-06 NOTE — Procedures (Addendum)
LHC, RHC and coronary angiogram done via right radial and right anticubital vein.    LAD and LCx has mild to mdoearte CAD  RCA has luminal irregularities.  Patient tolerated procedure.  Medical management

## 2016-08-06 NOTE — Other (Signed)
Pt is prepped and ready for the procedure. Video watched.

## 2016-08-06 NOTE — Discharge Summary (Addendum)
Discharge Summary    Patient: Tracey King MRN: [id number]  CSN: [id number]    Date of Birth: Jul 22, 1950  Age: 66 y.o.  Sex: female    DOA: 08/06/2016 LOS:  LOS: 0 days   Discharge Date:      Primary Care Provider:  Delena Bali, MD    Admission Diagnoses: Angina class III, abnormal stress test, pre op for renal transplant     Discharge Diagnoses:  CAD  Problem List as of 08/06/2016  Date Reviewed: 06/08/2016          Codes Class Noted - Resolved    ESRD on hemodialysis [facility]) ICD-10-CM: N18.6, Z99.2  ICD-9-CM: 585.6, V45.11  04/03/2016 - Present        Chronic kidney disease ICD-10-CM: N18.9  ICD-9-CM: 585.9  12/26/2015 - Present              Discharge Medications:     Current Discharge Medication List      CONTINUE these medications which have NOT CHANGED    Details   allopurinol (ZYLOPRIM) 100 mg tablet Take  by mouth daily.      folic acid 1 mg tab 0.5 mg, multivitamin, stress formula tab 1 Tab Take  by mouth daily.      isosorbide dinitrate (ISORDIL) 40 mg tablet Take 10 mg by mouth two (2) times a day.      lidocaine-prilocaine (EMLA) topical cream Place small amount on left arm graft 30 min before dialysis  Qty: 30 g, Refills: 3      albuterol (PROVENTIL HFA, VENTOLIN HFA, PROAIR HFA) 90 mcg/actuation inhaler Take 2 Puffs by inhalation every four (4) hours as needed for Wheezing.      tiotropium bromide (SPIRIVA RESPIMAT) 2.5 mcg/actuation inhaler Take 2 Puffs by inhalation daily.      aspirin 81 mg chewable tablet Take 81 mg by mouth daily.      calcitRIOL (ROCALTROL) 0.25 mcg capsule Take 0.25 mcg by mouth daily.      colchicine 0.6 mg tablet Take 0.6 mg by mouth daily.      fluticasone-salmeterol (ADVAIR) 100-50 mcg/dose diskus inhaler Take 1 Puff by inhalation every twelve (12) hours.      metoprolol succinate (TOPROL-XL) 25 mg XL tablet Take  by mouth daily.      Polyethylene Glycol 3350 powd by Does Not Apply route.      amLODIPine (NORVASC) 10 mg tablet Take  by mouth daily.       atorvastatin (LIPITOR) 10 mg tablet Take 10 mg by mouth nightly.      Omeprazole delayed release (PRILOSEC D/R) 20 mg tablet Take 20 mg by mouth daily.      traMADol (ULTRAM) 50 mg tablet Take 50 mg by mouth every six (6) hours as needed for Pain.      oxyCODONE-acetaminophen (PERCOCET 7.5) 7.5-325 mg per tablet Take 2 Tabs by mouth every six (6) hours as needed for Pain. Max Daily Amount: 8 Tabs.  Qty: 42 Tab, Refills: 0             Discharge Condition: Stable    Procedures : LHC, RHC and coronary angiogram     Consults: None       PHYSICAL EXAM   Visit Vitals   ??? BP 159/84 (BP 1 Location: Right arm, BP Patient Position: At rest)   ??? Pulse 93   ??? Temp 98.4 ??F (36.9 ??C)   ??? Resp 16   ??? Ht  (1.676 m)   ???  Wt 63.5 kg (140 lb)   ??? Breastfeeding No   ??? BMI 22.6 kg/m2     General: Awake, cooperative, no acute distress????  HEENT: NC, Atraumatic.  PERRLA, EOMI. Anicteric sclerae.  Lungs:  CTA Bilaterally. No Wheezing/Rhonchi/Rales.  Heart:  Regular  rhythm,?? No murmur, No Rubs, No Gallops  Abdomen: Soft, Non distended, Non tender. +Bowel sounds,   Extremities: No c/c/e  Psych:???? Not anxious or agitated.  Neurologic:?? No acute neurological deficits.                                     Admission HPI : see H/P    Hospital Course : Patient came for out patient LHC, RHC and coronary angiogram. LHC, RHC and coronary angiogram done via right radial and right anticubital vein.    LAD and LCx has mild to mdoearte CAD  RCA has luminal irregularities.  Patient tolerated procedure.  Medical management     Activity: No weight lifting not more than 5 lbs from right hand for 1 week, no driving for 24 hours     Diet: Cardiac     Follow-up: In cardiology clinic after 2 weeks    Disposition: Home    Minutes spent on discharge: 30 minutes      Labs: Results:       Chemistry No results for input(s): GLU, NA, K, CL, CO2, BUN, CREA, CA, AGAP, BUCR, TBIL, GPT, AP, TP, ALB, GLOB, AGRAT in the last 72 hours.   CBC w/Diff Recent Labs       08/06/16   0903   WBC  8.0   RBC  3.43*   HGB  9.7*   HCT  30.1*   PLT  163      Cardiac Enzymes No results for input(s): CPK, CKND1, MYO in the last 72 hours.    No lab exists for component: CKRMB, TROIP   Coagulation Recent Labs      08/06/16   0903   PTP  12.1   INR  0.9       Lipid Panel No results found for: CHOL, CHOLPOCT, CHOLX, CHLST, CHOLV, 884269, HDL, LDL, LDLC, DLDLP, 161096, VLDLC, VLDL, TGLX, TRIGL, TRIGP, TGLPOCT, CHHD, CHHDX   BNP No results for input(s): BNPP in the last 72 hours.   Liver Enzymes No results for input(s): TP, ALB, TBIL, AP, SGOT, GPT in the last 72 hours.    No lab exists for component: DBIL   Thyroid Studies No results found for: T4, T3U, TSH, TSHEXT         Significant Diagnostic Studies: No results found.      No results found for this or any previous visit.        CC: Delena Bali, MD

## 2016-08-06 NOTE — Progress Notes (Signed)
Venous sheath removed from right anti cub area, manuel pressure applied, 2x2 and Tegaderm applied to site, TR band removed from right wrist, no active drainage noted from site, area covered with 2x2 and Tegaderm, neuro/vasc assessment of right arm WNL

## 2016-08-06 NOTE — Other (Signed)
Cardiac Cath Lab:  Pre Procedure Chart Check     Patients chart was accessed and reviewed for possible and/or scheduled procedure.      Creatinine Clearance:  CREATININE: 7.2 MG/DL ABNORMAL (16/10/96 0454)  Estimated creatinine clearance: 7.1 mL/min    Total Contrast  Load:  3 x estimated clearance amount=  21.76ml    75% of Contrast Load:  0.75 x Total Contrast Load=    15.70ml    Recent Labs      08/06/16   0903   WBC  8.0   RBC  3.43*   HCT  30.1*   HGB  9.7*   PLT  163   INR  0.9   PTP  12.1       BMI: Body mass index is 22.6 kg/(m^2).    ALLERGIES:   Allergies   Allergen Reactions   ??? Erythromycin Nausea and Vomiting   ??? Reglan [Metoclopramide Hcl] Anxiety       Lines:        Peripheral IV 08/06/16 Right Antecubital (Active)   Site Assessment Clean, dry, & intact 08/06/2016  9:06 AM   Phlebitis Assessment 0 08/06/2016  9:06 AM   Infiltration Assessment 0 08/06/2016  9:06 AM   Dressing Status Clean, dry, & intact 08/06/2016  9:06 AM   Dressing Type Tape;Transparent 08/06/2016  9:06 AM   Hub Color/Line Status Pink;Capped 08/06/2016  9:06 AM       Peripheral IV 08/06/16 Right;Lower Forearm (Active)   Site Assessment Clean, dry, & intact 08/06/2016  9:19 AM   Phlebitis Assessment 0 08/06/2016  9:19 AM   Infiltration Assessment 0 08/06/2016  9:19 AM   Dressing Status Clean, dry, & intact 08/06/2016  9:19 AM   Dressing Type Tape;Transparent 08/06/2016  9:19 AM   Hub Color/Line Status Blue;Clotted 08/06/2016  9:19 AM          History:    Past Medical History:   Diagnosis Date   ??? Arrhythmia     intermittent "racing heart"   ??? Chronic kidney disease     ESRD, Stage IV   ??? Chronic obstructive pulmonary disease (HCC)    ??? GERD (gastroesophageal reflux disease)    ??? Hypertension    ??? Rheumatic fever 1970's   ??? Sarcoidosis      Past Surgical History:   Procedure Laterality Date   ??? HX LAP CHOLECYSTECTOMY     ??? HX TAH AND BSO       Patient Active Problem List   Diagnosis Code   ??? Chronic kidney disease N18.9    ??? ESRD on hemodialysis (HCC) N18.6, Z99.2

## 2016-08-06 NOTE — Progress Notes (Signed)
TRANSFER - OUT REPORT:  Bedside Verbal report given to Terrie, RN(name) on Tracey King being transferred to care(unit) for routine progression of care   Report consisted of patient???s Situation, Background, Assessment and   Recommendations(SBAR).   Information from the following report(s) SBAR, Kardex, Procedure Summary, MAR, Recent Results and Cardiac Rhythm nsr 99bpm was reviewed with the receiving nurse.    Cath Lab Report:    Procedure:  [ x] LHC  [ x] RHC   PTCA    Peripheral    Pacemaker  TEE   DCC    Access site:   [x ] Right  Left  [x ] Radial [ x] Brachial  Femoral  Jugular  Chest Wall      Sheath:           [x ]RRA Pulled in Cath Lab   [x ]RBV In place    To be pulled after:         Closure:          [ x] Radial Band   Manual Pressure      Angio Seal      Star Close     Per Close     Safe Guard    Site Assessment:   [ x] Clean, Dry, No bleeding     Minor oozing           Hematoma: Description:    Stents(s) Placement:   Left Main:                  LAD:                 Circ:                 RCA:                 EF:      Peripheral:      [x ] N/A    Infusion Angiomax  Integrelin  Heparin d/c'd:     Intra procedure Medications:    Fentanyl:128mcg IV  Versed:2mg  IV  Heparin:4000Units  Antiplatelet:  Other:    Lines:        Peripheral IV 08/06/16 Right Antecubital (Active)   Site Assessment Clean, dry, & intact 08/06/2016  9:06 AM   Phlebitis Assessment 0 08/06/2016  9:06 AM   Infiltration Assessment 0 08/06/2016  9:06 AM   Dressing Status Clean, dry, & intact 08/06/2016  9:06 AM   Dressing Type Tape;Transparent 08/06/2016  9:06 AM   Hub Color/Line Status Pink;Capped 08/06/2016  9:06 AM       Peripheral IV 08/06/16 Right;Lower Forearm (Active)   Site Assessment Clean, dry, & intact 08/06/2016  9:19 AM   Phlebitis Assessment 0 08/06/2016  9:19 AM   Infiltration Assessment 0 08/06/2016  9:19 AM   Dressing Status Clean, dry, & intact 08/06/2016  9:19 AM    Dressing Type Tape;Transparent 08/06/2016  9:19 AM   Hub Color/Line Status Blue;Clotted 08/06/2016  9:19 AM     Sheath 08/06/16 (Active)   Site Assessment Clean, dry, & intact 08/06/2016 12:32 PM   Dressing Status Clean, dry, & intact 08/06/2016 12:32 PM   Dressing Type Transparent 08/06/2016 12:32 PM   Hub Color/Line Status Green 08/06/2016 12:32 PM   Hemostasis  Dressing applied during procedure 08/06/2016 12:32 PM  Patient Vitals for the past 4 hrs:   Temp Pulse Resp BP   08/06/16 0849 98.4 ??F (36.9 ??C) 93 16 159/84         Extended / Orthostatic Vitals:    Vital Signs  Level of Consciousness: Alert (08/06/16 0849)  Temp: 98.4 ??F (36.9 ??C) (08/06/16 0849)  Temp Source: Oral (08/06/16 0849)  Pulse (Heart Rate): 93 (08/06/16 0849)  Heart Rate Source: Monitor (08/06/16 0849)  Cardiac Rhythm: Normal sinus rhythm (08/06/16 0849)  Resp Rate: 16 (08/06/16 0849)  BP: 159/84 (08/06/16 0849)  MAP (Calculated): 109 (08/06/16 0849)  BP 1 Location: Right arm (08/06/16 0849)  BP 1 Method: Automatic (08/06/16 0849)  BP Patient Position: At rest (08/06/16 0849)  MEWS Score: 1 (08/06/16 0849)         Oxygen Therapy  O2 Device: Room air (08/06/16 1478)          Opportunity for questions and clarification was provided.

## 2016-08-06 NOTE — Progress Notes (Signed)
Pt took home bp medication with sips of water

## 2016-08-06 NOTE — Progress Notes (Signed)
Pt returned from cath lab via stretcher,  Pt aaox3, denies any distress. BP elevated. Dr Romilda Garret aware and into see pt.  adivised pt to take home BP meds after eating lunch

## 2016-08-06 NOTE — H&P (Signed)
Date of Surgery Update:  Tracey King was seen and examined.    History and physical has been reviewed. The patient has been examined. There have been no significant clinical changes since the completion of the originally dated History and Physical.    Signed By: Laveda Norman, MD     August 06, 2016 11:31 AM

## 2016-08-06 NOTE — Progress Notes (Addendum)
B/p 206/87 hr 89 . Denies CP.  Took home meds metoprolol 25 mg and norvasc 10 mg - see MAR for additional IVP meds given in cath lab.   Doctor Romilda Garret made aware of B/P reading New order given. Ne  New B/p  Reading 189/99 HR 88 to left leg - verified Doctor Romilda Garret if  Okay to give labetalol - GIVE MEDS. SEE MAR.   Will monitor B/p EVERY 15 MINUTES X1 HR. D/c . Pt agreeing.   1557 b/p 187/87 HR 87  Post void- BRP.pain scale  from 8 to 6 . Tolerable. Encouraged to rest   For 24 hrs. D/c teaching done and signed by sibling. Agreed with F/u  Appointment and activity limitation.    escorted via w/c .  All belongings accounted.

## 2016-08-08 ENCOUNTER — Emergency Department: Admit: 2016-08-09 | Payer: MEDICARE | Primary: Internal Medicine

## 2016-08-08 DIAGNOSIS — I2510 Atherosclerotic heart disease of native coronary artery without angina pectoris: Secondary | ICD-10-CM

## 2016-08-08 NOTE — ED Notes (Addendum)
Pt being admitted to room 339.  Called report to [place], Charity fundraiser.  Pt being transported to the unit by this RN via WC.

## 2016-08-08 NOTE — ED Notes (Signed)
Dr Delene Loll was aware that pt refused that Phenergan IM earlier for saying it causes her to be paranoid- Dr Lucianne Muss ok with the refusal. Pt being admitted to the hospital.

## 2016-08-08 NOTE — ED Notes (Signed)
"  My head is really sore" as rates 8/10.  Acetaminophen IV infusing- see MAR.  Pt refused the adm of Phenergan IM.

## 2016-08-08 NOTE — ED Provider Notes (Signed)
[facility]  EMERGENCY DEPARTMENT HISTORY AND PHYSICAL EXAM    Date: 08/08/2016  Patient Name: Tracey King  Date of Birth: Sep 12, 1950  Medical Record Number: [id number]    History of Presenting Illness     Chief Complaint   Patient presents with   ??? Headache   ??? Nausea   ??? Hypertension   ??? Rapid Heart Rate       History Provided By: Patient    Chief Complaint: HA  Duration: 1 Days  Timing:  Constant  Location: Right temporal  Quality: Aching and Pounding  Severity: 10 out of 10  Modifying Factors: Pain began s/p cardiac cath yesterday (but did not have stent placed)  Associated Symptoms: Blurred vision onset 4 hours ago    Additional History (Context):   8:28 PM   Tracey King is a 66 y.o. female with PMHX COPD, HTN, CKD, dialysis, arrhythmia (intermittent heart racing), GERD, & sarcoidosis, who presents to the emergency department C/O 10/10 right temporal HA onset yesterday s/p cardiac cath yesterday (but did not have stent placed). Associated symptoms include intermittent blurred vision onset 4 hours ago. Reports hx of headaches, but none similar to this episode. Pt had taken Advil without relief. Cardiac cath was performed by Dr. Berton Lan due to stress test. Pt is supposed to be taking ASA daily, but ran out of this medication today. Pt also takes NTG and had taken 1 dose SL PTA. PSHx of lap chole and hysterectomy. Pt denies black spots in vision, abdominal pain, weakness, speech difficulty, fever, chills, and any other Sx or complaints.      Shx: +former tobacco use, -EtOH use, -illicit drug use    PCP: Verl Bangs, MD  Specialist: Zipporah Plants, MD (Cardiology)     Current Facility-Administered Medications   Medication Dose Route Frequency Provider Last Rate Last Dose   ??? promethazine (PHENERGAN) injection 12.5 mg  12.5 mg IntraMUSCular NOW Robbie Lis, MD       ??? [START ON 08/09/2016] metoprolol tartrate (LOPRESSOR) tablet 50 mg  50 mg Oral BID Philomena Doheny, MD        ??? heparin (porcine) injection 5,000 Units  5,000 Units SubCUTAneous Q8H Philomena Doheny, MD       ??? [START ON 08/09/2016] buffered aspirin (BUFFERIN) tablet 325 mg  325 mg Oral DAILY Philomena Doheny, MD         Current Outpatient Prescriptions   Medication Sig Dispense Refill   ??? famotidine (PEPCID) 20 mg tablet Take 20 mg by mouth two (2) times a day.     ??? allopurinol (ZYLOPRIM) 100 mg tablet Take  by mouth daily.     ??? atorvastatin (LIPITOR) 10 mg tablet Take 10 mg by mouth nightly.     ??? isosorbide dinitrate (ISORDIL) 40 mg tablet Take 10 mg by mouth two (2) times a day.     ??? aspirin 81 mg chewable tablet Take 81 mg by mouth daily.     ??? calcitRIOL (ROCALTROL) 0.25 mcg capsule Take 0.25 mcg by mouth daily.     ??? colchicine 0.6 mg tablet Take 0.6 mg by mouth daily.     ??? metoprolol succinate (TOPROL-XL) 25 mg XL tablet Take  by mouth daily.     ??? amLODIPine (NORVASC) 10 mg tablet Take  by mouth daily.     ??? traMADol (ULTRAM) 50 mg tablet Take 50 mg by mouth every six (6) hours as needed for  Pain.     ??? folic acid 1 mg tab 0.5 mg, multivitamin, stress formula tab 1 Tab Take  by mouth daily.     ??? oxyCODONE-acetaminophen (PERCOCET 7.5) 7.5-325 mg per tablet Take 2 Tabs by mouth every six (6) hours as needed for Pain. Max Daily Amount: 8 Tabs. 42 Tab 0   ??? albuterol (PROVENTIL HFA, VENTOLIN HFA, PROAIR HFA) 90 mcg/actuation inhaler Take 2 Puffs by inhalation every four (4) hours as needed for Wheezing.     ??? tiotropium bromide (SPIRIVA RESPIMAT) 2.5 mcg/actuation inhaler Take 2 Puffs by inhalation daily.     ??? fluticasone-salmeterol (ADVAIR) 100-50 mcg/dose diskus inhaler Take 1 Puff by inhalation every twelve (12) hours.     ??? Polyethylene Glycol 3350 powd by Does Not Apply route.           Past History     Past Medical History:  Past Medical History:   Diagnosis Date   ??? Arrhythmia     intermittent "racing heart"   ??? Chronic kidney disease     ESRD, Stage IV   ??? Chronic obstructive pulmonary disease ([place])     ??? GERD (gastroesophageal reflux disease)    ??? Hypertension    ??? Rheumatic fever 1970's   ??? Sarcoidosis        Past Surgical History:  Past Surgical History:   Procedure Laterality Date   ??? HX LAP CHOLECYSTECTOMY     ??? HX TAH AND BSO         Family History:  Family History   Problem Relation Age of Onset   ??? Heart Attack Father        Social History:  Social History   Substance Use Topics   ??? Smoking status: Former Smoker     Quit date: 05/06/1968   ??? Smokeless tobacco: Never Used   ??? Alcohol use No       Allergies:  Allergies   Allergen Reactions   ??? Erythromycin Nausea and Vomiting   ??? Reglan [Metoclopramide Hcl] Anxiety         Review of Systems     Review of Systems   Constitutional: Negative for chills and fever.   Eyes: Positive for visual disturbance (blurred).   Gastrointestinal: Negative for abdominal pain.   Neurological: Positive for headaches. Negative for speech difficulty and weakness.   All other systems reviewed and are negative.      Physical Exam     Vitals:    08/08/16 2225 08/08/16 2230 08/08/16 2235 08/08/16 2252   BP: 161/70 151/71 142/60 132/63   Pulse: 94 86 84 79   Resp: _0 Temp:    98.9 ??F (37.2 ??C)   SpO2: 96% 98% 95% 97%   Weight:       Height:         Physical Exam   Constitutional: She is oriented to person, place, and time. She appears well-developed and well-nourished.   HENT:   Head: Normocephalic and atraumatic.   Right Ear: External ear normal.   Left Ear: External ear normal.   Nose: Nose normal.   Mouth/Throat: Oropharynx is clear and moist.   Eyes: Conjunctivae and EOM are normal. Pupils are equal, round, and reactive to light. Right eye exhibits no discharge. Left eye exhibits no discharge. No scleral icterus.   Neck: Normal range of motion. Neck supple. No JVD present. No tracheal deviation present.   Cardiovascular: Regular rhythm and intact distal pulses.  Tachycardia present.    Murmur heard.   Systolic murmur is present with a grade of 2/6    Pulmonary/Chest: Effort normal and breath sounds normal.   Abdominal: Soft. Bowel sounds are normal. She exhibits no distension. There is no tenderness.   No HSM  Healed surgical scars   Musculoskeletal: Normal range of motion.   Fistula with +bruits  Extremities without edema   Neurological: She is alert and oriented to person, place, and time. She has normal strength and normal reflexes. No cranial nerve deficit. Coordination normal. GCS eye subscore is 4. GCS verbal subscore is 5. GCS motor subscore is 6.   No focal motor weakness.  Normal neuro exam.   Skin: Skin is warm and dry. No rash noted.   Psychiatric: She has a normal mood and affect. Her behavior is normal.   Nursing note and vitals reviewed.      Diagnostic Study Results     Labs -     Recent Results (from the past 12 hour(s))   EKG, 12 LEAD, INITIAL    Collection Time: 08/08/16  8:27 PM   Result Value Ref Range    Ventricular Rate 102 BPM    Atrial Rate 102 BPM    P-R Interval 196 ms    QRS Duration 92 ms    Q-T Interval 346 ms    QTC Calculation (Bezet) 450 ms    Calculated P Axis 67 degrees    Calculated R Axis 76 degrees    Calculated T Axis 75 degrees    Diagnosis       Sinus tachycardia  Left ventricular hypertrophy with repolarization abnormality  Abnormal ECG  No previous ECGs available     URINALYSIS W/ RFLX MICROSCOPIC    Collection Time: 08/08/16  9:06 PM   Result Value Ref Range    Color YELLOW      Appearance CLEAR      Specific gravity <1.005 (L) 1.005 - 1.030    pH (UA) >8.5 (H) 5.0 - 8.0    Protein 300 (A) NEG mg/dL    Glucose NEGATIVE  NEG mg/dL    Ketone NEGATIVE  NEG mg/dL    Bilirubin NEGATIVE  NEG      Blood SMALL (A) NEG      Urobilinogen 0.2 0.2 - 1.0 EU/dL    Nitrites NEGATIVE  NEG      Leukocyte Esterase NEGATIVE  NEG     URINE MICROSCOPIC ONLY    Collection Time: 08/08/16  9:06 PM   Result Value Ref Range    WBC 0 to 2 0 - 5 /hpf    RBC 0 to 1 0 - 5 /hpf    Epithelial cells 1+ 0 - 5 /lpf    Bacteria 1+ (A) NEG /hpf    CBC WITH AUTOMATED DIFF    Collection Time: 08/08/16  9:07 PM   Result Value Ref Range    WBC 9.1 4.6 - 13.2 K/uL    RBC 3.51 (L) 4.20 - 5.30 M/uL    HGB 9.8 (L) 12.0 - 16.0 g/dL    HCT 30.0 (L) 35.0 - 45.0 %    MCV 85.5 74.0 - 97.0 FL    MCH 27.9 24.0 - 34.0 PG    MCHC 32.7 31.0 - 37.0 g/dL    RDW 15.2 (H) 11.6 - 14.5 %    PLATELET 169 135 - 420 K/uL    MPV 9.3 9.2 - 11.8 FL    NEUTROPHILS 77 (H)  40 - 73 %    LYMPHOCYTES 15 (L) 21 - 52 %    MONOCYTES 5 3 - 10 %    EOSINOPHILS 3 0 - 5 %    BASOPHILS 0 0 - 2 %    ABS. NEUTROPHILS 6.9 1.8 - 8.0 K/UL    ABS. LYMPHOCYTES 1.4 0.9 - 3.6 K/UL    ABS. MONOCYTES 0.5 0.05 - 1.2 K/UL    ABS. EOSINOPHILS 0.3 0.0 - 0.4 K/UL    ABS. BASOPHILS 0.0 0.0 - 0.06 K/UL    DF AUTOMATED     METABOLIC PANEL, COMPREHENSIVE    Collection Time: 08/08/16  9:07 PM   Result Value Ref Range    Sodium 142 136 - 145 mmol/L    Potassium 3.0 (L) 3.5 - 5.5 mmol/L    Chloride 99 (L) 100 - 108 mmol/L    CO2 33 (H) 21 - 32 mmol/L    Anion gap 10 3.0 - 18 mmol/L    Glucose 104 (H) 74 - 99 mg/dL    BUN 8 7.0 - 18 MG/DL    Creatinine 3.74 (H) 0.6 - 1.3 MG/DL    BUN/Creatinine ratio 2 (L) 12 - 20      GFR est AA 15 (L) >60 ml/min/1.61m    GFR est non-AA 12 (L) >60 ml/min/1.745m   Calcium 9.1 8.5 - 10.1 MG/DL    Bilirubin, total 0.5 0.2 - 1.0 MG/DL    ALT (SGPT) 18 13 - 56 U/L    AST (SGOT) 17 15 - 37 U/L    Alk. phosphatase 90 45 - 117 U/L    Protein, total 7.3 6.4 - 8.2 g/dL    Albumin 3.5 3.4 - 5.0 g/dL    Globulin 3.8 2.0 - 4.0 g/dL    A-G Ratio 0.9 0.8 - 1.7     CARDIAC PANEL,(CK, CKMB & TROPONIN)    Collection Time: 08/08/16  9:07 PM   Result Value Ref Range    CK 143 26 - 192 U/L    CK - MB <1.0 <3.6 ng/ml    CK-MB Index  0.0 - 4.0 %     CALCULATION NOT PERFORMED WHEN RESULT IS BELOW LINEAR LIMIT    Troponin-I, Qt. 0.12 (H) 0.00 - 0.06 NG/ML   MAGNESIUM    Collection Time: 08/08/16  9:07 PM   Result Value Ref Range    Magnesium 1.9 1.6 - 2.6 mg/dL   PROTHROMBIN TIME + INR     Collection Time: 08/08/16  9:07 PM   Result Value Ref Range    Prothrombin time 12.4 11.5 - 15.2 sec    INR 1.0 0.8 - 1.2     PTT    Collection Time: 08/08/16  9:07 PM   Result Value Ref Range    aPTT 36.2 23.0 - 36.4 SEC       Radiologic Studies -     RADIOLOGY FINDINGS  Chest X-ray shows elevated right hemidiaphragm, scarring versus haziness in RLL, pleural based lesions  Pending review by Radiologist  Recorded by DoLevin BaconED Scribe, as dictated by PrRobbie LisMD      XR CHEST PORT    (Results Pending)     CT Results  (Last 48 hours)               08/08/16 2058  CT HEAD WO CONT Final result    Impression:  IMPRESSION:       No acute intracranial abnormalities.  Narrative:  EXAM: CT head       INDICATION: Blurry vision headaches       COMPARISON: None.       TECHNIQUE: Axial CT imaging of the head was performed without intravenous   contrast.       DOSE REDUCTION:  One or more dose reduction techniques were used on this CT:   automated exposure control, adjustment of the mAs and/or kVp according to   patient's size, and iterative reconstruction techniques. The specific techniques   utilized on this CT exam have been documented in the patient's electronic   medical record.       _______________       FINDINGS:       BRAIN AND POSTERIOR FOSSA: There is age-appropriate atrophy. There is no   intracranial hemorrhage, mass effect, or midline shift. There is an old lacunar   infarct in the right corona radiata measuring 12 mm. Old lacunar infarcts are   seen in both frontal lobe subcortical white matter and is moderate to   significant small vessel ischemic disease.       EXTRA-AXIAL SPACES AND MENINGES: There are no abnormal extra-axial fluid   collections.       CALVARIUM: Intact.       SINUSES: Clear.       OTHER: None.       _______________                 Medications Given in the ED:  Medications   promethazine (PHENERGAN) injection 12.5 mg (12.5 mg IntraMUSCular Refused 08/08/16 2120)    metoprolol tartrate (LOPRESSOR) tablet 50 mg (not administered)   heparin (porcine) injection 5,000 Units (not administered)   buffered aspirin (BUFFERIN) tablet 325 mg (not administered)   acetaminophen (OFIRMEV) infusion 1,000 mg (0 mg IntraVENous IV Completed 08/08/16 2214)   aspirin chewable tablet 324 mg (324 mg Oral Given 08/08/16 2213)   metoprolol tartrate (LOPRESSOR) tablet 50 mg (50 mg Oral Given 08/08/16 2214)   potassium chloride (KLOR-CON) packet 40 mEq (40 mEq Oral Given 08/08/16 2245)        Medical Decision Making     I am the first provider for this patient.    I reviewed the vital signs, available nursing notes, past medical history, past surgical history, family history and social history.    Records Reviewed: Nursing Notes, Old Medical Records, Previous electrocardiograms and Previous Laboratory Studies   Cardiac cath from yesterday 08/08/15: LAD and LCx has mild to moderate CAD. No previous EKGs.    Vital Signs-Reviewed the patient's vital signs.  Patient Vitals for the past 12 hrs:   Temp Pulse Resp BP SpO2   08/08/16 2252 98.9 ??F (37.2 ??C) 79 23 132/63 97 %   08/08/16 2235 - 84 16 142/60 95 %   08/08/16 2230 - 86 20 151/71 98 %   08/08/16 2225 - 94 28 161/70 96 %   08/08/16 2220 - 94 22 165/66 99 %   08/08/16 2215 - 95 19 140/79 99 %   08/08/16 2210 - 91 (!) 31 151/64 99 %   08/08/16 2205 - 92 20 160/69 99 %   08/08/16 2200 - 95 17 (!) 167/91 99 %   08/08/16 2155 - 93 19 161/68 100 %   08/08/16 2150 - 92 16 156/70 100 %   08/08/16 2145 - 95 29 161/70 100 %   08/08/16 2140 - 95 17 159/69 100 %   08/08/16 2135 - 100  18 178/75 99 %   08/08/16 2127 - (!) 102 16 174/83 96 %   08/08/16 2059 - (!) 110 28 - -   08/08/16 2040 - (!) 104 13 - 99 %   08/08/16 2035 - - - 173/76 100 %   08/08/16 2027 98.4 ??F (36.9 ??C) (!) 105 - 197/82 98 %       Provider Notes (Medical Decision Making):   Ddx: ACS, CHF, MI, PE, pneumonia, pneumothorax, AAA, dissection,  esophageal spasms, GERD, anemia, uremia. Rule out: other cardiovascular, pulmonary, or GI pathology.    Pulse Oximetry Analysis - Normal 98% on RA      Cardiac Monitor:  Rate: 105 bpm  Rhythm: Sinus Tachycardia       EKG interpretation: (Preliminary)   Rhythm: Sinus tachycardia. Rate (approx.): 102 bpm. LVH with repolarization abnormality. No STEMI.   EKG read by Robbie Lis, MD at 8:27 PM      Procedures:  Procedures     ED Course:   8:28 PM Initial assessment performed. Pt and/or pt's family are aware of the plan of care and are in agreement.    1000 PM pt pain free, neuro exam entirely normal, including no blurred vision ; bp and pulse improving.     10:05 PM Consult Note: Discussed patient's history, exam, and available diagnostics results over the telephone with Ival Bible, MD, cardiology, who agrees to admit to hospitalist for observation. Give ASA, 324 mg or a few 81 mg doses. Also states to increase metoprolol to 50 mg BID or extended release QD. He will consult and see her tomorrow.    10:22 PM  Consult Note: Discussed patient's history, exam, and available diagnostics results over the telephone with Ocie Cornfield, MD, internal medicine, who agrees to admit pt to Telemetry and states to treat pt's potassium.         Diagnosis and Disposition       Core Measures:  For Hospitalized Patients:    1. Hospitalization Decision Time:  The decision to hospitalize the patient was made by Robbie Lis, MD and Ival Bible, MD at 10:13 PM on 08/08/2016    2. Aspirin: Aspirin was given    3. Plan: Admit to Telemetry    10:23 PM Patient is being admitted to the hospital by Ocie Cornfield, MD. The results of their tests and reasons for their admission have been discussed with them and/or available family. They convey agreement and understanding for the need to be admitted and for their admission diagnosis.      Conditions on Admission:  Sepsis is not present at the time of admission. Thrombosis is possibly not  present at the time of admission. Urinary Tract Infection is not present at the time of admission. Pneumonia is not likely present at the time of admission. Wound infection is not present at the time of admission. Pressure Ulcer is not present at the time of admission.       Clinical Impression:    1. Chest discomfort    2. Elevated troponin    3. Blurred vision    4. Acute nonintractable headache, unspecified headache type        _______________________________    Attestations:  This note is prepared by Levin Bacon, acting as Scribe for Robbie Lis, MD.    Robbie Lis, MD:  The scribe's documentation has been prepared under my direction and personally reviewed by me in its entirety.  I confirm that the note above  accurately reflects all work, treatment, procedures, and medical decision making performed by me.  _______________________________

## 2016-08-08 NOTE — ED Notes (Signed)
Blood for labs completed by Emily,EDT.  Pt ambulatory to the restroom with a steady gait in an attempt to provide an urine specimen for lab  Ordered.

## 2016-08-08 NOTE — ED Notes (Signed)
Assumed care of pt from triage.  Pt presents to ED saying to have soreness of her head/HA since yesterday after heart cath- no stent necessary via report.  Neuro intact.  Pt is HD pt on T,Thur,Sat.  Last HD treatment today per pt.  Pt to CT for scan via WC by EDT, Irving Burton.  Emily,EDT, to attempt placement of PIV upon pt back to her room.

## 2016-08-08 NOTE — ED Notes (Signed)
Pt no longer has the HA.  "It is gone."

## 2016-08-08 NOTE — ED Triage Notes (Signed)
Pt states she had a cardiac cath done yesterday and then began having a headache which wouldn't go away. Pt has had high blood pressure since yesterday. Pt had dialysis today but continued to have headache, high blood pressure and then began having blurred vision, nausea, and felt as if heart was racing.

## 2016-08-08 NOTE — ED Notes (Signed)
Pt back from CT scan.  Xray at pt's bedside.  Emily,EDT, states she plans to attempt PIV placement/collect blood for labs once xray is complete.

## 2016-08-08 NOTE — ED Notes (Signed)
Pt wheeled to the unit via WC safely with tele in place.  Allison,RN assumed care of pt at bedside.

## 2016-08-08 NOTE — Other (Signed)
2330: TRANSFER - IN REPORT:    Verbal report received from [facility] RN(name) on Charie D Lose  being received from ER(unit) for routine progression of care      Report consisted of patient???s Situation, Background, Assessment and   Recommendations(SBAR).     Information from the following report(s) SBAR, Kardex, Intake/Output, MAR and Recent Results was reviewed with the receiving nurse.    Opportunity for questions and clarification was provided.      Assessment completed upon patient???s arrival to unit and care assumed.

## 2016-08-08 NOTE — ED Notes (Signed)
Dual assessment complete with Leonie Man, RN.  Pt has LT AV fistula, and RT radial drsg CDI, no other skin issues.

## 2016-08-08 NOTE — ED Notes (Signed)
Dr Lucianne Muss given the "OK" for the saltine crackers to be given to pt/provided accordingly.  Pt is potential admit to the hospital per Dr Lucianne Muss.

## 2016-08-09 ENCOUNTER — Inpatient Hospital Stay
Admit: 2016-08-09 | Discharge: 2016-08-10 | Disposition: A | Discharge status: Home / Self Care | Payer: MEDICARE | Attending: Family Medicine | Admitting: Family Medicine

## 2016-08-09 LAB — CBC W/O DIFF
HCT: 25.8 % — ABNORMAL LOW (ref 35.0–45.0)
HGB: 8.4 g/dL — ABNORMAL LOW (ref 12.0–16.0)
MCH: 27.9 PG (ref 24.0–34.0)
MCHC: 32.6 g/dL (ref 31.0–37.0)
MCV: 85.7 FL (ref 74.0–97.0)
MPV: 9.6 FL (ref 9.2–11.8)
PLATELET: 166 10*3/uL (ref 135–420)
RBC: 3.01 M/uL — ABNORMAL LOW (ref 4.20–5.30)
RDW: 15.2 % — ABNORMAL HIGH (ref 11.6–14.5)
WBC: 8.2 10*3/uL (ref 4.6–13.2)

## 2016-08-09 LAB — METABOLIC PANEL, BASIC
Anion gap: 10 mmol/L (ref 3.0–18)
BUN/Creatinine ratio: 2 — ABNORMAL LOW (ref 12–20)
BUN: 9 MG/DL (ref 7.0–18)
CO2: 30 mmol/L (ref 21–32)
Calcium: 8.6 MG/DL (ref 8.5–10.1)
Chloride: 102 mmol/L (ref 100–108)
Creatinine: 4.28 MG/DL — ABNORMAL HIGH (ref 0.6–1.3)
GFR est AA: 13 mL/min/{1.73_m2} — ABNORMAL LOW (ref >60)
GFR est non-AA: 10 mL/min/{1.73_m2} — ABNORMAL LOW (ref >60)
Glucose: 105 mg/dL — ABNORMAL HIGH (ref 74–99)
Potassium: 3.8 mmol/L (ref 3.5–5.5)
Sodium: 142 mmol/L (ref 136–145)

## 2016-08-09 LAB — URINALYSIS W/ RFLX MICROSCOPIC
Bilirubin: NEGATIVE
Glucose: NEGATIVE mg/dL
Ketone: NEGATIVE mg/dL
Leukocyte Esterase: NEGATIVE
Nitrites: NEGATIVE
Protein: 300 mg/dL — AB
Specific gravity: <1.005 — ABNORMAL LOW (ref 1.005–1.030)
Urobilinogen: 0.2 EU/dL (ref 0.2–1.0)
pH (UA): >8.5 — ABNORMAL HIGH (ref 5.0–8.0)

## 2016-08-09 LAB — CARDIAC PANEL,(CK, CKMB & TROPONIN)
CK - MB: <1 ng/ml (ref <3.6)
CK - MB: <1 ng/ml (ref <3.6)
CK - MB: <1 ng/ml (ref <3.6)
CK: 143 U/L (ref 26–192)
CK: 123 U/L (ref 26–192)
CK: 120 U/L (ref 26–192)
Troponin-I, QT: 0.12 NG/ML — ABNORMAL HIGH (ref 0.00–0.06)
Troponin-I, QT: 0.12 NG/ML — ABNORMAL HIGH (ref 0.00–0.06)
Troponin-I, QT: 0.11 NG/ML — ABNORMAL HIGH (ref 0.00–0.06)

## 2016-08-09 LAB — METABOLIC PANEL, COMPREHENSIVE
A-G Ratio: 0.9 (ref 0.8–1.7)
ALT (SGPT): 18 U/L (ref 13–56)
AST (SGOT): 17 U/L (ref 15–37)
Albumin: 3.5 g/dL (ref 3.4–5.0)
Alk. phosphatase: 90 U/L (ref 45–117)
Anion gap: 10 mmol/L (ref 3.0–18)
BUN/Creatinine ratio: 2 — ABNORMAL LOW (ref 12–20)
BUN: 8 MG/DL (ref 7.0–18)
Bilirubin, total: 0.5 MG/DL (ref 0.2–1.0)
CO2: 33 mmol/L — ABNORMAL HIGH (ref 21–32)
Calcium: 9.1 MG/DL (ref 8.5–10.1)
Chloride: 99 mmol/L — ABNORMAL LOW (ref 100–108)
Creatinine: 3.74 MG/DL — ABNORMAL HIGH (ref 0.6–1.3)
GFR est AA: 15 mL/min/{1.73_m2} — ABNORMAL LOW (ref >60)
GFR est non-AA: 12 mL/min/{1.73_m2} — ABNORMAL LOW (ref >60)
Globulin: 3.8 g/dL (ref 2.0–4.0)
Glucose: 104 mg/dL — ABNORMAL HIGH (ref 74–99)
Potassium: 3 mmol/L — ABNORMAL LOW (ref 3.5–5.5)
Protein, total: 7.3 g/dL (ref 6.4–8.2)
Sodium: 142 mmol/L (ref 136–145)

## 2016-08-09 LAB — CBC WITH AUTOMATED DIFF
ABS. BASOPHILS: 0 10*3/uL (ref 0.0–0.06)
ABS. EOSINOPHILS: 0.3 10*3/uL (ref 0.0–0.4)
ABS. LYMPHOCYTES: 1.4 10*3/uL (ref 0.9–3.6)
ABS. MONOCYTES: 0.5 10*3/uL (ref 0.05–1.2)
ABS. NEUTROPHILS: 6.9 10*3/uL (ref 1.8–8.0)
BASOPHILS: 0 % (ref 0–2)
EOSINOPHILS: 3 % (ref 0–5)
HCT: 30 % — ABNORMAL LOW (ref 35.0–45.0)
HGB: 9.8 g/dL — ABNORMAL LOW (ref 12.0–16.0)
LYMPHOCYTES: 15 % — ABNORMAL LOW (ref 21–52)
MCH: 27.9 PG (ref 24.0–34.0)
MCHC: 32.7 g/dL (ref 31.0–37.0)
MCV: 85.5 FL (ref 74.0–97.0)
MONOCYTES: 5 % (ref 3–10)
MPV: 9.3 FL (ref 9.2–11.8)
NEUTROPHILS: 77 % — ABNORMAL HIGH (ref 40–73)
PLATELET: 169 10*3/uL (ref 135–420)
RBC: 3.51 M/uL — ABNORMAL LOW (ref 4.20–5.30)
RDW: 15.2 % — ABNORMAL HIGH (ref 11.6–14.5)
WBC: 9.1 10*3/uL (ref 4.6–13.2)

## 2016-08-09 LAB — URINE MICROSCOPIC ONLY
RBC: 0 /hpf (ref 0–5)
WBC: 0 /hpf (ref 0–5)

## 2016-08-09 LAB — PROTHROMBIN TIME + INR
INR: 1 (ref 0.8–1.2)
Prothrombin time: 12.4 s (ref 11.5–15.2)

## 2016-08-09 LAB — MAGNESIUM: Magnesium: 1.9 mg/dL (ref 1.6–2.6)

## 2016-08-09 LAB — PTT: aPTT: 36.2 s (ref 23.0–36.4)

## 2016-08-09 MED ORDER — FLUTICASONE 100 MCG-VILANTEROL 25 MCG/DOSE BREATH ACTIVATED INHALER
100-25 mcg/dose | Freq: Every day | RESPIRATORY_TRACT | Status: DC
Start: 2016-08-09 — End: 2016-08-10
  Administered 2016-08-09 – 2016-08-10 (×2): via RESPIRATORY_TRACT

## 2016-08-09 MED ORDER — METOPROLOL TARTRATE 50 MG TAB
50 mg | Freq: Once | ORAL | Status: AC
Start: 2016-08-09 — End: 2016-08-08
  Administered 2016-08-09: 02:00:00 via ORAL

## 2016-08-09 MED ORDER — LISINOPRIL 20 MG TAB
20 mg | Freq: Every day | ORAL | Status: DC
Start: 2016-08-09 — End: 2016-08-10
  Administered 2016-08-09 – 2016-08-10 (×2): via ORAL

## 2016-08-09 MED ORDER — COLCHICINE 0.6 MG TAB
0.6 mg | Freq: Every day | ORAL | Status: DC
Start: 2016-08-09 — End: 2016-08-09
  Administered 2016-08-09: 14:00:00 via ORAL

## 2016-08-09 MED ORDER — ACETAMINOPHEN 325 MG TABLET
325 mg | Freq: Four times a day (QID) | ORAL | Status: DC | PRN
Start: 2016-08-09 — End: 2016-08-10
  Administered 2016-08-09 – 2016-08-10 (×3): via ORAL

## 2016-08-09 MED ORDER — AMLODIPINE 5 MG TAB
5 mg | Freq: Every day | ORAL | Status: DC
Start: 2016-08-09 — End: 2016-08-10
  Administered 2016-08-09 – 2016-08-10 (×2): via ORAL

## 2016-08-09 MED ORDER — DEXAMETHASONE SODIUM PHOSPHATE 4 MG/ML IJ SOLN
4 mg/mL | INTRAMUSCULAR | Status: AC
Start: 2016-08-09 — End: 2016-08-09
  Administered 2016-08-09 – 2016-08-10 (×2): via INTRAVENOUS

## 2016-08-09 MED ORDER — ASPIRIN 81 MG CHEWABLE TAB
81 mg | Freq: Every day | ORAL | Status: DC
Start: 2016-08-09 — End: 2016-08-10
  Administered 2016-08-10: 20:00:00 via ORAL

## 2016-08-09 MED ORDER — HEPARIN (PORCINE) 5,000 UNIT/ML IJ SOLN
5000 unit/mL | Freq: Three times a day (TID) | INTRAMUSCULAR | Status: DC
Start: 2016-08-09 — End: 2016-08-10
  Administered 2016-08-09 – 2016-08-10 (×6): via SUBCUTANEOUS

## 2016-08-09 MED ORDER — CALCITRIOL 0.25 MCG CAP
0.25 mcg | Freq: Every day | ORAL | Status: DC
Start: 2016-08-09 — End: 2016-08-10
  Administered 2016-08-09 – 2016-08-10 (×2): via ORAL

## 2016-08-09 MED ORDER — FAMOTIDINE 20 MG TAB
20 mg | Freq: Two times a day (BID) | ORAL | Status: DC
Start: 2016-08-09 — End: 2016-08-09
  Administered 2016-08-09 (×2): via ORAL

## 2016-08-09 MED ORDER — ALBUTEROL SULFATE HFA 90 MCG/ACTUATION AEROSOL INHALER
90 mcg/actuation | RESPIRATORY_TRACT | Status: DC | PRN
Start: 2016-08-09 — End: 2016-08-10

## 2016-08-09 MED ORDER — METOPROLOL SUCCINATE SR 25 MG 24 HR TAB
25 mg | Freq: Every day | ORAL | Status: DC
Start: 2016-08-09 — End: 2016-08-08

## 2016-08-09 MED ORDER — PROMETHAZINE 25 MG/ML INJECTION
25 mg/mL | INTRAMUSCULAR | Status: DC
Start: 2016-08-09 — End: 2016-08-08

## 2016-08-09 MED ORDER — ACETAMINOPHEN 1,000 MG/100 ML (10 MG/ML) IV
1000 mg/100 mL (10 mg/mL) | Freq: Once | INTRAVENOUS | Status: AC
Start: 2016-08-09 — End: 2016-08-08
  Administered 2016-08-09: 01:00:00 via INTRAVENOUS

## 2016-08-09 MED ORDER — VALPROATE SODIUM 100 MG/ML IV
5005100 mg/5 mL (100 mg/mL) | Freq: Once | INTRAVENOUS | Status: AC
Start: 2016-08-09 — End: 2016-08-09
  Administered 2016-08-09: 16:00:00 via INTRAVENOUS

## 2016-08-09 MED ORDER — ASPIRIN 81 MG CHEWABLE TAB
81 mg | ORAL | Status: AC
Start: 2016-08-09 — End: 2016-08-08
  Administered 2016-08-09: 02:00:00 via ORAL

## 2016-08-09 MED ORDER — LISINOPRIL 20 MG TAB
20 mg | Freq: Every day | ORAL | Status: DC
Start: 2016-08-09 — End: 2016-08-09

## 2016-08-09 MED ORDER — COLCHICINE 0.6 MG TAB
0.6 mg | Freq: Every day | ORAL | Status: DC
Start: 2016-08-09 — End: 2016-08-10
  Administered 2016-08-10: 20:00:00 via ORAL

## 2016-08-09 MED ORDER — FAMOTIDINE 20 MG TAB
20 mg | Freq: Every day | ORAL | Status: DC
Start: 2016-08-09 — End: 2016-08-10
  Administered 2016-08-10: 13:00:00 via ORAL

## 2016-08-09 MED ORDER — ATORVASTATIN 10 MG TAB
10 mg | Freq: Every evening | ORAL | Status: DC
Start: 2016-08-09 — End: 2016-08-10
  Administered 2016-08-09 – 2016-08-10 (×2): via ORAL

## 2016-08-09 MED ORDER — DEXAMETHASONE SODIUM PHOSPHATE 4 MG/ML IJ SOLN
4 mg/mL | INTRAMUSCULAR | Status: DC
Start: 2016-08-09 — End: 2016-08-09

## 2016-08-09 MED ORDER — BISACODYL 10 MG RECTAL SUPPOSITORY
10 mg | Freq: Every day | RECTAL | Status: DC | PRN
Start: 2016-08-09 — End: 2016-08-10
  Administered 2016-08-09: 16:00:00 via RECTAL

## 2016-08-09 MED ORDER — METOPROLOL TARTRATE 50 MG TAB
50 mg | Freq: Two times a day (BID) | ORAL | Status: DC
Start: 2016-08-09 — End: 2016-08-10
  Administered 2016-08-09 – 2016-08-10 (×3): via ORAL

## 2016-08-09 MED ORDER — HEPARIN (PORCINE) 1,000 UNIT/ML IJ SOLN
1000 unit/mL | INTRAMUSCULAR | Status: DC
Start: 2016-08-09 — End: 2016-08-10
  Administered 2016-08-10: 21:00:00 via ARTERIOVENOUS_FISTULA

## 2016-08-09 MED ORDER — ISOSORBIDE DINITRATE 10 MG TAB
10 mg | Freq: Two times a day (BID) | ORAL | Status: DC
Start: 2016-08-09 — End: 2016-08-09
  Administered 2016-08-09 (×2): via ORAL

## 2016-08-09 MED ORDER — UMECLIDINIUM 62.5 MCG/ACTUATION BLISTER POWDER FOR INHALATION
62.5 mcg/actuation | Freq: Every day | RESPIRATORY_TRACT | Status: DC
Start: 2016-08-09 — End: 2016-08-10
  Administered 2016-08-09 – 2016-08-10 (×2): via RESPIRATORY_TRACT

## 2016-08-09 MED ORDER — ASPIRIN, BUFFERED 325 MG TAB
325 mg | Freq: Every day | ORAL | Status: DC
Start: 2016-08-09 — End: 2016-08-09
  Administered 2016-08-09: 14:00:00 via ORAL

## 2016-08-09 MED ORDER — ALLOPURINOL 100 MG TAB
100 mg | Freq: Every day | ORAL | Status: DC
Start: 2016-08-09 — End: 2016-08-10
  Administered 2016-08-09 – 2016-08-10 (×2): via ORAL

## 2016-08-09 MED ORDER — ONDANSETRON (PF) 4 MG/2 ML INJECTION
4 mg/2 mL | Freq: Four times a day (QID) | INTRAMUSCULAR | Status: DC | PRN
Start: 2016-08-09 — End: 2016-08-10
  Administered 2016-08-09: 08:00:00 via INTRAVENOUS

## 2016-08-09 MED ORDER — MORPHINE 2 MG/ML INJECTION
2 mg/mL | INTRAMUSCULAR | Status: DC | PRN
Start: 2016-08-09 — End: 2016-08-10
  Administered 2016-08-09: 19:00:00 via INTRAVENOUS

## 2016-08-09 MED ORDER — POTASSIUM CHLORIDE 20 MEQ ORAL PACKET FOR SOLUTION
20 mEq | ORAL | Status: AC
Start: 2016-08-09 — End: 2016-08-08
  Administered 2016-08-09: 03:00:00 via ORAL

## 2016-08-09 MED ORDER — POLYETHYLENE GLYCOL 3350 17 GRAM (100 %) ORAL POWDER PACKET
17 gram | Freq: Every day | ORAL | Status: DC
Start: 2016-08-09 — End: 2016-08-10
  Administered 2016-08-09 – 2016-08-10 (×2): via ORAL

## 2016-08-09 MED ORDER — TRAMADOL 50 MG TAB
50 mg | Freq: Four times a day (QID) | ORAL | Status: DC | PRN
Start: 2016-08-09 — End: 2016-08-10
  Administered 2016-08-09: 14:00:00 via ORAL

## 2016-08-09 MED FILL — METOPROLOL TARTRATE 50 MG TAB: 50 mg | ORAL | Qty: 1

## 2016-08-09 MED FILL — ATORVASTATIN 10 MG TAB: 10 mg | ORAL | Qty: 1

## 2016-08-09 MED FILL — TRI-BUFFERED ASPIRIN 325 MG TABLET: 325 mg | ORAL | Qty: 1

## 2016-08-09 MED FILL — MORPHINE 2 MG/ML INJECTION: 2 mg/mL | INTRAMUSCULAR | Qty: 1

## 2016-08-09 MED FILL — LISINOPRIL 20 MG TAB: 20 mg | ORAL | Qty: 1

## 2016-08-09 MED FILL — VENTOLIN HFA 90 MCG/ACTUATION AEROSOL INHALER: 90 mcg/actuation | RESPIRATORY_TRACT | Qty: 8

## 2016-08-09 MED FILL — INCRUSE ELLIPTA 62.5 MCG/ACTUATION POWDER FOR INHALATION: 62.5 mcg/actuation | RESPIRATORY_TRACT | Qty: 7

## 2016-08-09 MED FILL — CALCITRIOL 0.25 MCG CAP: 0.25 mcg | ORAL | Qty: 1

## 2016-08-09 MED FILL — PROMETHAZINE 25 MG/ML INJECTION: 25 mg/mL | INTRAMUSCULAR | Qty: 1

## 2016-08-09 MED FILL — DEXAMETHASONE SODIUM PHOSPHATE 4 MG/ML IJ SOLN: 4 mg/mL | INTRAMUSCULAR | Qty: 1

## 2016-08-09 MED FILL — POTASSIUM CHLORIDE 20 MEQ ORAL PACKET FOR SOLUTION: 20 mEq | ORAL | Qty: 2

## 2016-08-09 MED FILL — BREO ELLIPTA 100 MCG-25 MCG/DOSE POWDER FOR INHALATION: 100-25 mcg/dose | RESPIRATORY_TRACT | Qty: 28

## 2016-08-09 MED FILL — METOPROLOL SUCCINATE SR 25 MG 24 HR TAB: 25 mg | ORAL | Qty: 1

## 2016-08-09 MED FILL — ISOSORBIDE DINITRATE 10 MG TAB: 10 mg | ORAL | Qty: 1

## 2016-08-09 MED FILL — OFIRMEV 1,000 MG/100 ML (10 MG/ML) INTRAVENOUS SOLUTION: 1000 mg/100 mL (10 mg/mL) | INTRAVENOUS | Qty: 100

## 2016-08-09 MED FILL — HEPARIN (PORCINE) 5,000 UNIT/ML IJ SOLN: 5000 unit/mL | INTRAMUSCULAR | Qty: 1

## 2016-08-09 MED FILL — ACETAMINOPHEN 325 MG TABLET: 325 mg | ORAL | Qty: 2

## 2016-08-09 MED FILL — AMLODIPINE 5 MG TAB: 5 mg | ORAL | Qty: 2

## 2016-08-09 MED FILL — FAMOTIDINE 20 MG TAB: 20 mg | ORAL | Qty: 1

## 2016-08-09 MED FILL — BISACODYL 10 MG RECTAL SUPPOSITORY: 10 mg | RECTAL | Qty: 1

## 2016-08-09 MED FILL — COLCHICINE 0.6 MG TAB: 0.6 mg | ORAL | Qty: 1

## 2016-08-09 MED FILL — ASPIRIN 81 MG CHEWABLE TAB: 81 mg | ORAL | Qty: 4

## 2016-08-09 MED FILL — VALPROATE SODIUM 100 MG/ML IV: 500 mg/5 mL (100 mg/mL) | INTRAVENOUS | Qty: 5

## 2016-08-09 MED FILL — ALLOPURINOL 100 MG TAB: 100 mg | ORAL | Qty: 1

## 2016-08-09 MED FILL — ONDANSETRON (PF) 4 MG/2 ML INJECTION: 4 mg/2 mL | INTRAMUSCULAR | Qty: 2

## 2016-08-09 MED FILL — ASPIRIN 81 MG CHEWABLE TAB: 81 mg | ORAL | Qty: 1

## 2016-08-09 MED FILL — TRAMADOL 50 MG TAB: 50 mg | ORAL | Qty: 1

## 2016-08-09 MED FILL — MIRALAX 17 GRAM ORAL POWDER PACKET: 17 gram | ORAL | Qty: 1

## 2016-08-09 NOTE — Consults (Signed)
[facility]  CONSULTATION    Name:Twaddell, MAYO OWCZARZAK  MR#: [id number]  DOB: 11-29-1950  ACCOUNT #: [id number]   DATE OF SERVICE: 08/09/2016    REASON FOR CONSULTATION:  Minimal troponin elevation in the setting of hypertension and palpitation and recent cardiac catheterization.    HISTORY OF PRESENT ILLNESS:  I was called by Dr. Lucianne Muss, ER physician, to see this patient.  The patient is a 66 year old pleasant female with complex past medical history of hypertension, end-stage renal disease on hemodialysis, sarcoidosis, she underwent recent cardiac catheterization and found to have nonobstructive mild to moderate disease that will be treated with the medication, came to the hospital with some nonspecific symptom of headache and shortness of breath and found to have minimal troponin elevation of 0.12 with normal CK-MB.  The repeat cardiac panels are normal and now the patient is headache free, comfortable, not in any distress, was sleeping comfortably.  Denied any fever, cough or pleuritic chest pain.    PAST MEDICAL HISTORY:  Significant for sarcoidosis, hypertension, end-stage renal disease on dialysis, history of rheumatic fever, mild to moderate nonobstructive coronary artery disease.    PAST SURGICAL HISTORY:  Reviewed including hysterectomy, cholecystectomy.    FAMILY HISTORY:  Father has history of cardiac disease.    ALLERGIES:  THE PATIENT IS ALLERGIC TO REGLAN AND ERYTHROMYCIN.    REVIEW OF SYSTEMS:  Ten point review of system completed, positive pertinent findings discussed in the history of present illness.  Rest of the systems are normal.    HOME MEDICATIONS:  Reviewed and discussed which include famotidine, allopurinol, atorvastatin, isosorbide, albuterol, aspirin, colchicine, metoprolol, amlodipine, tramadol, folic acid, oxycodone, Spiriva, Advair.    PHYSICAL EXAMINATION:    GENERAL: At the time of consultation, the patient is comfortable, not in any distress.  No more headache.  VITAL  SIGNS:  Temperature is 98.4, heart rate is 81, respirations 17, blood pressure is 133/72, pulse oximeter is 98%.  HEENT:  Head is atraumatic, normocephalic.  NECK:  Supple, no JVD, no carotid bruit.  HEART:  S1, S2 audible.  LUNGS:  Bilateral air entry positive.  No added sound.  ABDOMEN:  Soft, nontender.  Bowel sounds audible.  EXTREMITIES:  No edema.  Pulses are palpable.  NEUROLOGIC:  No focal motor or sensory deficit.  DERMATOLOGICAL:  No skin rash.  MUSCULOSKELETAL:  No gross joint deformity.    LABORATORY DATA:  EKG:  Sinus rhythm with nonspecific LVH changes.  Troponin was 0.12 and 0.11 with normal CK-MB.  Sodium 142, potassium 3.8.  Hemoglobin is 8.4, white count is 8.2, platelet count is 166.    ASSESSMENT AND PLAN:  1.  Minimal troponin elevation with normal CK-MB in ESRD patient and recent cardiac catheterization have shown mild to moderate nonobstructive coronary artery disease.  2.  Hypertension.  3.  Headache, which has resolved.  4.  Sarcoidosis.  5.  End-stage renal disease.  6.  Dyslipidemia.    RECOMMENDATION:  At this point, patient is feeling stable, does not want me to change any medication.  She was complaining palpitation/SOB after taking amlodipine, but she is willing to try to take different timing.  If the patient continue to have more symptoms after taking amlodipine will stop and switch to other antihypertensive medication.  Continue with aspirin, atenolol, Toprol-XL, amlodipine, lisinopril and risk factor management.  If patient remains stable, can be discharged tomorrow and then follow up with her primary care physician.    Thank you for allowing me to  participate in the management of this pleasant patient.      Asa Saunas, MD       MA / MN  D: 08/09/2016 21:07     T: 08/09/2016 21:54  JOB #: [id number]

## 2016-08-09 NOTE — Consults (Signed)
Consults by Hyman Bible, DO at 08/09/16 1519                Author: Hyman Bible, DO  Service: Nephrology  Author Type: Physician       Filed: 08/09/16 1526  Date of Service: 08/09/16 1519  Status: Signed          Editor: Hyman Bible, DO (Physician)            Consult Orders        1. IP CONSULT TO NEPHROLOGY [[id number]] ordered by Gar Ponto, MD at 08/09/16 1052                                      RENAL CONSULT   08/09/2016      Patient:  Tracey King   DOB:  10-28-1950   Gender:  female   MRN #:  [id number]      Consulting Physician:  Hyman Bible, DO,   Assessment:     )Principal Problem:     Chest discomfort (08/08/2016)      Active Problems:     Chronic kidney disease (12/26/2015)        ESRD on hemodialysis (HCC) (04/03/2016)        Blurred vision (08/08/2016)        Elevated troponin (08/08/2016)        Headache (08/08/2016)         HTN   Plan:     I started Pt on Isosorbide thinking that her chest pain was angina. I think we should put he ron Lisinopril PT does not recall any allergy or tongue swelling from this      HD I am, if pt ready to be discharged in am before  10, she can go to her dialysis unit for HD.    D/w pt at length         History of Present Illness:   Tracey King is a 66 y.o. year  old female with ongoing ESRD who has had rising BP in the past month which part of is due to conflict with her daughter, Pt had chest pain and  tightness that was atypical. PT had cardiac cath after which she started to have severe atypical headache that was new to her. This got worse during HD so she came to ED. Her BP was high. This is improving.               Past Medical History:        Diagnosis  Date         ?  Arrhythmia            intermittent "racing heart"         ?  Chronic kidney disease            ESRD, Stage IV         ?  Chronic obstructive pulmonary disease (HCC)       ?  GERD (gastroesophageal reflux disease)       ?  Hypertension       ?  Rheumatic fever  1970's         ?   Sarcoidosis            Past Surgical History:         Procedure  Laterality  Date          ?  HX LAP CHOLECYSTECTOMY              ?  HX TAH AND BSO              Family History         Problem  Relation  Age of Onset          ?  Heart Attack  Father            Allergies        Allergen  Reactions         ?  Erythromycin  Nausea and Vomiting         ?  Reglan [Metoclopramide Hcl]  Anxiety          Current Facility-Administered Medications             Medication  Dose  Route  Frequency  Provider  Last Rate  Last Dose              ?  bisacodyl (DULCOLAX) suppository 10 mg   10 mg  Rectal  DAILY PRN  Gar Ponto, MD     10 mg at 08/09/16 1135     ?  polyethylene glycol (MIRALAX) packet 17 g   17 g  Oral  DAILY  Gar Ponto, MD     17 g at 08/09/16 1135     ?  morphine injection 2 mg   2 mg  IntraVENous  Q4H PRN  Gar Ponto, MD     2 mg at 08/09/16 1433     ?  dexamethasone (DECADRON) 4 mg/mL injection 4 mg   4 mg  IntraVENous  Q4H  Gar Ponto, MD     4 mg at 08/09/16 1433     ?  metoprolol tartrate (LOPRESSOR) tablet 50 mg   50 mg  Oral  BID  Gar Ponto, MD     50 mg at 08/09/16 1010     ?  albuterol (PROVENTIL HFA, VENTOLIN HFA, PROAIR HFA) inhaler 2 Puff   2 Puff  Inhalation  Q4H PRN  Gar Ponto, MD           ?  allopurinol (ZYLOPRIM) tablet 100 mg   100 mg  Oral  DAILY  Gar Ponto, MD     100 mg at 08/09/16 1010              ?  amLODIPine (NORVASC) tablet 10 mg   10 mg  Oral  DAILY  Gar Ponto, MD     10 mg at 08/09/16 1009              ?  aspirin chewable tablet 81 mg   81 mg  Oral  DAILY  Gar Ponto, MD     Stopped at 08/09/16 0900     ?  atorvastatin (LIPITOR) tablet 10 mg   10 mg  Oral  QHS  Gar Ponto, MD     10 mg at 08/08/16 2322     ?  calcitRIOL (ROCALTROL) capsule 0.25 mcg   0.25 mcg  Oral  DAILY  Gar Ponto, MD     0.25 mcg at 08/09/16 1009     ?  colchicine tablet 0.6 mg   0.6 mg  Oral  DAILY  Gar Ponto, MD     0.6 mg at 08/09/16 1010     ?  famotidine (PEPCID) tablet 20 mg  20 mg   Oral  BID  Gar Ponto, MD     20 mg at 08/09/16 1009     ?  fluticasone-vilanterol (BREO ELLIPTA) 173mcg-25mcg/puff   1 Puff  Inhalation  DAILY  Gar Ponto, MD     1 Puff at 08/09/16 1012     ?  isosorbide dinitrate (ISORDIL) tablet 10 mg   10 mg  Oral  BID  Gar Ponto, MD     10 mg at 08/09/16 1009     ?  umeclidinium (INCRUSE ELLIPTA) 62.5 mcg/actuation   1 Puff  Inhalation  DAILY  Gar Ponto, MD     1 Puff at 08/09/16 1012     ?  traMADol (ULTRAM) tablet 50 mg   50 mg  Oral  Q6H PRN  Gar Ponto, MD     50 mg at 08/09/16 1026     ?  heparin (porcine) injection 5,000 Units   5,000 Units  SubCUTAneous  Q8H  Gar Ponto, MD     5,000 Units at 08/09/16 1437     ?  acetaminophen (TYLENOL) tablet 650 mg   650 mg  Oral  Q6H PRN  Gar Ponto, MD     650 mg at 08/09/16 1335              ?  ondansetron (ZOFRAN) injection 4 mg   4 mg  IntraVENous  Q6H PRN  Gar Ponto, MD     4 mg at 08/09/16 0403           Review of Symptoms:  Below symptoms negative if not in [place].   Consitutional Symptoms: Fever, weight loss, weight gain, fatigue   Eyes:  pain, sudden vision loss, blurry vision, double vision              bleeding nose, sore throat, hoarseness   GI: nausea, vomiting, diarrhea, pain, blood in stool   Pulmonary; Cough, Shortness of breath, Wheezing's   Cardiac: chest pain, palpitation   Musculoskeletal: difficulty walking, falls, pain over muscle, joint pain, weakness   GU: dysuria, blood in urine, pain with urination, difficulty voiding   Neurologia: dizziness, syncope, focal weakness, difficulty speaking   Integumentary: rash, redness, ulcer    Psychiatric:  Depression suicidal ideation      Objective:     Visit Vitals         ?  BP  129/60 (BP 1 Location: Right arm, BP Patient Position: At rest)     ?  Pulse  80     ?  Temp  98.6 ??F (37 ??C)     ?  Resp  18     ?  Ht  5\' 6"  (1.676 m)     ?  Wt  69.5 kg (153 lb 3.2 oz)     ?  SpO2  97%     ?  Breastfeeding  No         ?  BMI  24.73 kg/m2              Well  developed and groomed   Eyes: anicteric, no subconjectival hemorrahge, eye lid without lesion   Ears, nose, mouth and throat without visible mass, scars, lesion   Neck: supple, trachea in midline position, thyriod without pain on palpation nor enlargement.   Respiratory: good effort, no rales, good breath sounds, no wheezings   Cardiovascular:  Normal rate, regular rythem normal S1 S2 no rubs  or gallops   GI: soft, nontender,  Nomral bowel sound   Musculoskeletal: No clubbing cynosis, no petechia   Skin: no rash, lesion or ulcers   Neruoligcal: no focal dificit grossly   Psychicatric: good judgment and insights, good memory. No flatt affect. Non anxiouse.               Laboratory Data:     Lab Results         Component  Value  Date            BUN  9  08/09/2016       BUN  8  08/08/2016       BUN  56 (H)  02/15/2016       NA  142  08/09/2016       NA  142  08/08/2016       NA  143  02/15/2016       CO2  30  08/09/2016       CO2  33 (H)  08/08/2016            CO2  24  02/15/2016          Lab Results         Component  Value  Date            WBC  8.2  08/09/2016       HGB  8.4 (L)  08/09/2016            HCT  25.8 (L)  08/09/2016        Imaging have been reviewed:   )Ct Head Wo Cont      Result Date: 08/08/2016   EXAM: CT head INDICATION: Blurry vision headaches COMPARISON: None. TECHNIQUE: Axial CT imaging of the head was performed without intravenous contrast. DOSE REDUCTION:  One or more dose reduction techniques were used on this CT: automated exposure control,  adjustment of the mAs and/or kVp according to patient's size, and iterative reconstruction techniques. The specific techniques utilized on this CT exam have been documented in the patient's electronic medical record.  FINDINGS: BRAIN AND POSTERIOR FOSSA:  There is age-appropriate atrophy. There is no intracranial hemorrhage, mass effect, or midline shift. 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 Old lacunar infarcts are seen  in both frontal lobe subcortical white matter  and is moderate to significant small vessel ischemic disease. EXTRA-AXIAL SPACES AND MENINGES: There are no abnormal extra-axial fluid collections. CALVARIUM: Intact. SINUSES: Clear. OTHER: None.       IMPRESSION: No acute intracranial abnormalities.      Xr Chest Port      Result Date: 08/08/2016   EXAM: AP portable upright chest INDICATION: Headaches COMPARISON: None.FINDINGS: Heart and mediastinal contours are normal. There are calcified hilar lymph nodes. There are fibrotic changes at the right lung base. No focal consolidation or effusion or  pneumothorax seen. There are old bilateral rib fractures.    IMPRESSION: No acute process                )Codylee Patil R Chante Mayson, DO,

## 2016-08-09 NOTE — Progress Notes (Signed)
Pharmacy Dosing Services:   Pharmacist Renal Dosing Progress Note for Famotidine  Physician: Annitta Jersey    The following medication: Famotidine was automatically dose-adjusted per [facility] P&T Committee Protocol, with respect to renal function.      Consult provided for this   66 y.o. , female.    Pt Weight:   Wt Readings from Last 1 Encounters:   08/09/16 69.5 kg (153 lb 3.2 oz)         Previous Regimen  20 mg PO BID   Serum Creatinine Lab Results   Component Value Date/Time    Creatinine 4.28 (H) 08/09/2016 01:05 AM       Creatinine Clearance Estimated Creatinine Clearance: 11.9 mL/min (based on Cr of 4.28).   BUN Lab Results   Component Value Date/Time    BUN 9 08/09/2016 01:05 AM       Dosage changed to:  20 mg PO Daily    Pharmacy to continue to monitor patient daily.   Will make dosage adjustments based upon changing renal function.  Signed Clement Husbands, PHARMD. Contact information: [phone]

## 2016-08-09 NOTE — Other (Signed)
9604 assumed care of pt after bedside verbal report was given by off going nurse, pt awake in bed, resting, no acute distress noted, will monitor     1026 pt complained of headache, rates it an 8/10, pt medicated for pain (see MAR), will monitor     1118 notified Dr Annitta Jersey during IDR that pt was still having headache, additional orders given for depacon, will monitor    1136 Depacon 500 mg ivp starte, will monitor     1352 notified Dr Annitta Jersey that pt was still complaining of headache, awaiting new orders    1433 morphine 2 mg IVP and decadron given by IVP, will monitor     1502 pt resting in bed quietly, will monitor     1524 Bedside and Verbal shift change report given to Wilhemina Cash, RN (oncoming nurse) by [employer], Museum/gallery conservator). Report included the following information SBAR, Kardex and MAR.

## 2016-08-09 NOTE — Consults (Signed)
[facility]    Name:Tracey King, Tracey King  MR#: [id number]  DOB: [birth year]  ACCOUNT #: [id number]   DATE OF SERVICE: 08/09/2016    REASON FOR CONSULTATION:  Minimal troponin elevation in the setting of hypertension and palpitation and recent cardiac catheterization.    HISTORY OF PRESENT ILLNESS:  I was called by Dr. Lucianne Muss, ER physician, to see this patient.  The patient is a 66 year old pleasant female with complex past medical history of hypertension, end-stage renal disease on hemodialysis, sarcoidosis, she underwent recent cardiac catheterization and found to have nonobstructive mild to moderate disease that will be treated with the medication, came to the hospital with some nonspecific symptom of headache and shortness of breath and found to have minimal troponin elevation of 0.12 with normal CK-MB.  The repeat cardiac panels are normal and now the patient is headache free, comfortable, not in any distress, was sleeping comfortably.  Denied any fever, cough or pleuritic chest pain.    PAST MEDICAL HISTORY:  Significant for sarcoidosis, hypertension, end-stage renal disease on dialysis, history of rheumatic fever, mild to moderate nonobstructive coronary artery disease.    PAST SURGICAL HISTORY:  Reviewed including hysterectomy, cholecystectomy.    FAMILY HISTORY:  Father has history of cardiac disease.    ALLERGIES:  THE PATIENT IS ALLERGIC TO REGLAN AND ERYTHROMYCIN.    REVIEW OF SYSTEMS:  Ten point review of system completed, positive pertinent findings discussed in the history of present illness.  Rest of the systems are normal.    HOME MEDICATIONS:  Reviewed and discussed which include famotidine, allopurinol, atorvastatin, isosorbide, albuterol, aspirin, colchicine, metoprolol, amlodipine, tramadol, folic acid, oxycodone, Spiriva, Advair.    PHYSICAL EXAMINATION:    GENERAL: At the time of consultation, the patient is comfortable, not in any distress.  No more headache.   VITAL SIGNS:  Temperature is 98.4, heart rate is 81, respirations 17, blood pressure is 133/72, pulse oximeter is 98%.  HEENT:  Head is atraumatic, normocephalic.  NECK:  Supple, no JVD, no carotid bruit.  HEART:  S1, S2 audible.  LUNGS:  Bilateral air entry positive.  No added sound.  ABDOMEN:  Soft, nontender.  Bowel sounds audible.  EXTREMITIES:  No edema.  Pulses are palpable.  NEUROLOGIC:  No focal motor or sensory deficit.  DERMATOLOGICAL:  No skin rash.  MUSCULOSKELETAL:  No gross joint deformity.    LABORATORY DATA:  EKG:  Sinus rhythm with nonspecific LVH changes.  Troponin was 0.12 and 0.11 with normal CK-MB.  Sodium 142, potassium 3.8.  Hemoglobin is 8.4, white count is 8.2, platelet count is 166.    ASSESSMENT AND PLAN:  1.  Minimal troponin elevation with normal CK-MB in ESRD patient and recent cardiac catheterization have shown mild to moderate nonobstructive coronary artery disease.  2.  Hypertension.  3.  Headache, which has resolved.  4.  Sarcoidosis.  5.  End-stage renal disease.  6.  Dyslipidemia.    RECOMMENDATION:  At this point, patient is feeling stable, does not want me to change any medication.  She was complaining palpitation/SOB after taking amlodipine, but she is willing to try to take different timing.  If the patient continue to have more symptoms after taking amlodipine will stop and switch to other antihypertensive medication.  Continue with aspirin, atenolol, Toprol-XL, amlodipine, lisinopril and risk factor management.  If patient remains stable, can be discharged tomorrow and then follow up with her primary care physician.    Thank you for allowing me to  participate in the management of this pleasant patient.      Tracey Saunas, MD       MA / MN  D: 08/09/2016 21:07     T: 08/09/2016 21:54  JOB #: [id number]

## 2016-08-09 NOTE — Progress Notes (Signed)
Hospitalist Progress Note    Patient: Tracey King MRN: [id number]  CSN: [id number]    Date of Birth: 1950-12-29  Age: 66 y.o.  Sex: female    DOA: 08/08/2016 LOS:  LOS: 1 day          Chief Complaint:    Headache and chest pain      Assessment/Plan     Headache  S/p recent cath  ESRD on T/Th/Sat dialysis  CAD, nonobstructive  Anemia of CKD    One dose depakote IV for headache, may be more related to her uncontrolled BP, although it is coming down now  Cardiology to see here  Enzymes are flat, she has no chest pain    May be able to go home soon if doing well        Disposition :  Patient Active Problem List   Diagnosis Code   ??? Chronic kidney disease N18.9   ??? ESRD on hemodialysis (HCC) N18.6, Z99.2   ??? Blurred vision H53.8   ??? Chest discomfort R07.89   ??? Elevated troponin R74.8   ??? Headache R51       Subjective:    Denies severe headache this am, but has head pain when she coughs and still headache     Review of systems:    Constitutional: denies fevers, chills, myalgias  Respiratory: denies SOB, cough  Cardiovascular: denies chest pain, palpitations  Gastrointestinal: denies nausea, vomiting      Vital signs/Intake and Output:  Visit Vitals   ??? BP 140/80 (BP 1 Location: Right arm, BP Patient Position: At rest)   ??? Pulse 92   ??? Temp 98.4 ??F (36.9 ??C)   ??? Resp 18   ??? Ht  (1.676 m)   ??? Wt 69.5 kg (153 lb 3.2 oz)   ??? SpO2 98%   ??? Breastfeeding No   ??? BMI 24.73 kg/m2     Current Shift:     Last three shifts:       Exam:    General: Well developed, alert, NAD, OX3  Head/Neck: NCAT, supple, No masses, No lymphadenopathy  VHQ:IONGEXB rate and rhythm, no M/R/G, S1/S2 heard, no thrill  Lungs:Clear to auscultation bilaterally, no wheezes, rhonchi, or rales  Abdomen: Soft, Nontender, No distention, Normal Bowel sounds, No hepatomegaly  Extremities: No C/C/E, pulses palpable 2+  Neuro:grossly normal , follows commands  Psych:appropriate                Labs: Results:       Chemistry Recent Labs      08/09/16    0105  08/08/16   2107   GLU  105*  104*   NA  142  142   K  3.8  3.0*   CL  102  99*   CO2  30  33*   BUN  9  8   CREA  4.28*  3.74*   CA  8.6  9.1   AGAP  10  10   BUCR  2*  2*   AP   --   90   TP   --   7.3   ALB   --   3.5   GLOB   --   3.8   AGRAT   --   0.9      CBC w/Diff Recent Labs      08/09/16   0105  08/08/16   2107   WBC  8.2  9.1   RBC  3.01*  3.51*   HGB  8.4*  9.8*   HCT  25.8*  30.0*   PLT  166  169   GRANS   --   77*   LYMPH   --   15*   EOS   --   3      Cardiac Enzymes Recent Labs      08/09/16   0700  08/09/16   0105   CPK  120  123   CKND1  CALCULATION NOT PERFORMED WHEN RESULT IS BELOW LINEAR LIMIT  CALCULATION NOT PERFORMED WHEN RESULT IS BELOW LINEAR LIMIT      Coagulation Recent Labs      08/08/16   2107   PTP  12.4   INR  1.0   APTT  36.2       Lipid Panel No results found for: CHOL, CHOLPOCT, CHOLX, CHLST, CHOLV, 884269, HDL, LDL, LDLC, DLDLP, 161096, VLDLC, VLDL, TGLX, TRIGL, TRIGP, TGLPOCT, CHHD, CHHDX   BNP No results for input(s): BNPP in the last 72 hours.   Liver Enzymes Recent Labs      08/08/16   2107   TP  7.3   ALB  3.5   AP  90   SGOT  17      Thyroid Studies No results found for: T4, T3U, TSH, TSHEXT     Procedures/imaging: see electronic medical records for all procedures/Xrays and details which were not copied into this note but were reviewed prior to creation of Plan      Gar Ponto, MD

## 2016-08-09 NOTE — Other (Signed)
Bedside and Verbal shift change report given to A. Matamoras, RN (oncoming nurse) by L. [place], RN (offgoing nurse). Report included the following information SBAR, Kardex, Intake/Output and MAR.

## 2016-08-09 NOTE — Progress Notes (Signed)
0000: Received report from [employer]. Pt presented to the ER with c/o chest pain,blurred vision and nausea. Elevated troponin of 0.12, trending cardiac enzyme tests Q6hrs. Pt is alert and oriented x4. White board updated. Pt currently has no reports of pain. Questions and concerns of the pt addressed at this time. Will continue to monitor.    0246: Troponin still elevated at 0.12.     0400: Pt reports nausea and 8/10 pain/ headache, PRN Zofran and Tylenol given.

## 2016-08-09 NOTE — Progress Notes (Signed)
Chart reviewed.  Pt admitted to hospital for chest discomfort. CM will follow for discharge planning needs.    Readmission Risk Assessment: Low Risk and MSSP/[facility] Help [facility] patients    RRAT Score: 1 - 12    Initial Assessment:  Per chart, pt is a 66 y.o. female with PMHX COPD, HTN, CKD, dialysis, arrhythmia (intermittent heart racing), GERD, & sarcoidosis, who presents to the emergency department C/O 10/10 right temporal HA onset yesterday s/p cardiac cath yesterday (but did not have stent placed). Associated symptoms include intermittent blurred vision onset 4 hours ago. Reports hx of headaches, but none similar to this episode. Pt had taken Advil without relief. Cardiac cath was performed by Dr. Romilda Garret due to stress test. Pt is supposed to be taking ASA daily, but ran out of this medication today. Pt also takes NTG and had taken 1 dose SL PTA. PSHx of lap chole and hysterectomy    Emergency Contact:    Name Relation Home Work Mobile    ?? Nelson,Carrita Daughter   [phone]     Pertinent Medical Hx:   PMHX COPD, HTN, CKD, dialysis, arrhythmia (intermittent heart racing), GERD, & sarcoidosis,     PCP/Specialists:  MD Brunetta Genera Services:     DME:     Low Risk Care Transition Plan:  1. Evaluate for [facility] or [facility], community care coordination of resources  2. Involve patient/caregiver in assessment, planning, education and implement of intervention.  3. CM daily patient care huddles/interdisciplinary rounds.  4. PCP/Specialist appointment within 7 - 10 days made prior to discharge.  5. Facilitate transportation and logistics for follow-up appointments.  6. Handoff to [facility] Group Nurse Navigator or PCP practice      Care Management Interventions  Transition of Care Consult (CM Consult): Discharge Planning

## 2016-08-09 NOTE — H&P (Signed)
[facility]  HISTORY AND PHYSICAL      Name:Tracey King, Tracey King  MR#: [id number]  DOB: 11-15-1950  ACCOUNT #: [id number]   ADMIT DATE: 08/08/2016    ADMITTING DIAGNOSES:  1.  Uncontrolled hypertension.  2.  Headache.  3.  Status post cardiac catheterization.    4.  End-stage renal disease on dialysis Tuesday, Thursday and Saturday.  5.  Coronary disease.  6.  History of Gout.  7.  Sarcoidosis.    HISTORY OF PRESENT ILLNESS:  The patient had a cardiac cath 2 days ago on 04/03.  She had mild to moderate coronary disease discovered.  No stents were placed.  She tolerated the procedure well and was discharged home.  She went to dialysis today and was not feeling very well.  Apparently yesterday, she says she does not remember much of the day, but she had a headache today, just felt pain all over her head.  She apparently had some blurred vision associated.  She really could not get relief from the headache, even with Advil, and so she came into the emergency room.  Her troponin was mildly elevated.  She had no chest pain.  She describes dyspnea on exertion over the past at least few months that has been going on, and thus she has been evaluated by her pulmonologist, and now subsequently by the cardiologist, but ultimately, she felt poorly enough that they recommended admission to the hospital for cardiology evaluation too.  She feels like she has had some type of reaction to something that was given during her cardiac cath at this point, which was now 48 hours ago.    SOCIAL HISTORY:  Former smoker.  Quit smoking in 1970.  No alcohol or drug use.      PRIMARY CARE PHYSICIAN:  Letitia Caul, MD     MEDICATIONS:  At home include Rocaltrol, colchicine, Isordil, aspirin, Lipitor, Zyloprim Pepcid, Toprol, Norvasc, tramadol, folic acid, Spiriva, Proventil and MiraLax.    PAST MEDICAL HISTORY:  End-stage renal disease, on Tuesday, Thursday, Saturday dialysis, COPD, sarcoidosis, GERD, hypertension, rheumatic fever  as a child, hypertension obviously.    PAST SURGICAL HISTORY:  History of laparoscopic cholecystectomy and hysterectomy.    FAMILY HISTORY:  Father had heart disease.    ALLERGIES:  ERYTHROMYCIN AND REGLAN.    REVIEW OF SYSTEMS:    CONSTITUTIONAL:  She denies any fevers or chills.  RESPIRATORY:  Chronic shortness of breath with exertion that has been going on for some time, that is not new, and she denies coughing, wheezing or productive cough.  CARDIOVASCULAR:  She denies chest pain, jaw pain, arm pain or leg swelling.  No palpitations.  GASTROINTESTINAL:  No abdominal pain, nausea, vomiting or diarrhea.  NEUROLOGIC:  She had a generalized headache today, it was severe.  She probably had it yesterday, although she states she cannot remember much of yesterday.  She had some blurred vision a few hours before coming to the emergency room.  No speech difficulty.  No unilateral weakness or paresthesias.  No facial numbness.  GENITOURINARY:  No difficulty urinating.  HEMATOLOGIC:  No bleeding or bruisability.    PHYSICAL EXAMINATION:  GENERAL:  The patient is awake and alert, in no acute distress, sitting comfortably on the edge of the bed.  She looks well developed and pleasant, cooperative and oriented x3.  VITAL SIGNS:  Temp is 99, pulse 86, blood pressure 159/82, respiratory rate 16, SaO2 98% on room air.  HEENT:  Head is normocephalic,  atraumatic.  Sclerae are anicteric.  Conjunctivae pink.  Oropharynx clear, no lesions.  NECK:  Supple, no thyromegaly or lymphadenopathy.  LUNGS:  Clear bilaterally.  No rales, rhonchi or wheezes.  CARDIAC:  Regular rate and rhythm.  No murmur, rub or gallop.  ABDOMEN:  Soft, nontender, no distention, normal bowel sounds.  EXTREMITIES:  Lower extremities, no clubbing, cyanosis or edema.  Distal pulses are palpable.  Her feet are warm.  SKIN:  Shows no evidence of rash.  NEUROLOGIC:  Nonfocal.  Cranial nerves II-XII are grossly intact.  Upper  and lower extremity strength is equal bilaterally.    LABORATORY DATA:  Urinalysis showed no nitrites or leukocyte esterase.  Her potassium was 3.0, creatinine 3.74, magnesium 1.9.  Troponin 0.12, CK was normal at 143.  INR was 1.  White count was 9.1, hemoglobin and hematocrit were 9.8 and 30, platelets are 169.    DIAGNOSTIC DATA: CT of the head showed no acute intracranial abnormalities.  Chest x-ray was no acute disease.  EKG showed sinus tachycardia.    ASSESSMENT:  1.  Uncontrolled hypertension.  2.  Headache.  3.  Status post cardiac catheterization 2 days ago.  4.  End-stage renal disease, on dialysis.  5.  Sarcoidosis.  6.  History of hypertension.    PLAN:  She received a dose of metoprolol and potassium in the emergency room.  Her blood pressure is better.  She actually feels resolution of her headache now.  Will continue her Zyloprim, Norvasc, aspirin, Lipitor, Rocaltrol, colchicine, Pepcid, Breo Ellipta, Isordil, Ellipta inhaler that she is on at home.  Follow cardiac markers.  Consult with her cardiologist.  Follow her blood pressure.  Treat headache with Tylenol as needed, Zofran for nausea, tramadol if she has moderately severe pain, and follow up a CBC and basic metabolic panel in the morning.  Put her on a renal regular diet in the interim.  Consult to cardiology has been called from the ER.  She is stable for telemetry monitoring currently, and her last blood pressure was down to 159/82.  Again, her headache is resolved at this point in time, and she appears nontoxic.  Anticipated length of stay 24 hours.      Chriss Czar, MD       RI/DN  D: 08/08/2016 23:38     T: 08/09/2016 09:20  JOB #: [id number]  CC: Letitia Caul MD

## 2016-08-09 NOTE — Other (Signed)
2328: Bedside and Verbal shift change report given to [employer] (oncoming nurse) by Jonette Mate, RN(offgoing nurse). Report included the following information SBAR, Kardex, Intake/Output, MAR, Recent Results and Cardiac Rhythm SR with peaked T waves.

## 2016-08-09 NOTE — Progress Notes (Signed)
D/c plan: anticipate home when she is mediclly cleared  Met with patient at bedside. States she lives with her daughter. States she goes to [employer] on T TH SAT at 1240 states she is independent and still drives. Denies needs of DME cm will continue to follow and assist with safe transition  Care Management Interventions  PCP Verified by CM: Yes  Transition of Care Consult (CM Consult): Discharge Planning  Current Support Network: Relative's Home  Confirm Follow Up Transport: Family  Plan discussed with Pt/Family/Caregiver: Yes  Freedom of Choice Offered: Yes  Discharge Location  Discharge Placement: Home with family assistance

## 2016-08-09 NOTE — Progress Notes (Signed)
1540 Assumed patient care from off going nurse Milana Na, RN. Patient is resting in bed and appears to be in no sign of distress. Bed left in lowest position with bed alarm set, call bell left within reach, and whiteboard updated.

## 2016-08-09 NOTE — Progress Notes (Signed)
1900: Received report from [facility]. White board updated. Pt is alert and oriented x4. Pt does not report any pain, nausea, or difficulty breathing at this time. Questions and concerns of the pt are addressed. Pt appears to be very tired, and it was reported that she slept during most of day shift. Will continue to monitor.    2140: Pt reports a HA of 5/10, PRN Tylenol 650 mg given.    2200: Pt reassessed for pain, HA has decreased to 3/10, pt wants to rest.     2300: Pt is observed sleeping, and does not appear to be in any pain or respiratory distress at this time. Bed is in lowest position and call bell is within reach.

## 2016-08-09 NOTE — Progress Notes (Signed)
Problem: Falls - Risk of  Goal: *Absence of Falls  Document Schmid Fall Risk and appropriate interventions in the flowsheet.   Outcome: Progressing Towards Goal  Fall Risk Interventions:            Medication Interventions: Evaluate medications/consider consulting pharmacy, Patient to call before getting OOB, Teach patient to arise slowly

## 2016-08-09 NOTE — Consults (Signed)
RENAL CONSULT  08/09/2016    Patient:  Tracey King  DOB:  05-Feb-1951  Gender:  female  MRN #:  [id number]    Consulting Physician:  Hyman Bible, DO,  Assessment:    )Principal Problem:    Chest discomfort (08/08/2016)    Active Problems:    Chronic kidney disease (12/26/2015)      ESRD on hemodialysis (HCC) (04/03/2016)      Blurred vision (08/08/2016)      Elevated troponin (08/08/2016)      Headache (08/08/2016)      HTN  Plan:    I started Pt on Isosorbide thinking that her chest pain was angina. I think we should put he ron Lisinopril PT does not recall any allergy or tongue swelling from this    HD I am, if pt ready to be discharged in am before  10, she can go to her dialysis unit for HD.   D/w pt at length      History of Present Illness:  Tracey King is a 66 y.o. year old female with ongoing ESRD who has had rising BP in the past month which part of is due to conflict with her daughter, Pt had chest pain and tightness that was atypical. PT had cardiac cath after which she started to have severe atypical headache that was new to her. This got worse during HD so she came to ED. Her BP was high. This is improving.         Past Medical History:   Diagnosis Date   ??? Arrhythmia     intermittent "racing heart"   ??? Chronic kidney disease     ESRD, Stage IV   ??? Chronic obstructive pulmonary disease (HCC)    ??? GERD (gastroesophageal reflux disease)    ??? Hypertension    ??? Rheumatic fever 1970's   ??? Sarcoidosis      Past Surgical History:   Procedure Laterality Date   ??? HX LAP CHOLECYSTECTOMY     ??? HX TAH AND BSO       Family History   Problem Relation Age of Onset   ??? Heart Attack Father      Allergies   Allergen Reactions   ??? Erythromycin Nausea and Vomiting   ??? Reglan [Metoclopramide Hcl] Anxiety     Current Facility-Administered Medications   Medication Dose Route Frequency Provider Last Rate Last Dose   ??? bisacodyl (DULCOLAX) suppository 10 mg  10 mg Rectal DAILY PRN Gar Ponto, MD   10 mg at 08/09/16 1135    ??? polyethylene glycol (MIRALAX) packet 17 g  17 g Oral DAILY Gar Ponto, MD   17 g at 08/09/16 1135   ??? morphine injection 2 mg  2 mg IntraVENous Q4H PRN Gar Ponto, MD   2 mg at 08/09/16 1433   ??? dexamethasone (DECADRON) 4 mg/mL injection 4 mg  4 mg IntraVENous Q4H Gar Ponto, MD   4 mg at 08/09/16 1433   ??? metoprolol tartrate (LOPRESSOR) tablet 50 mg  50 mg Oral BID Gar Ponto, MD   50 mg at 08/09/16 1010   ??? albuterol (PROVENTIL HFA, VENTOLIN HFA, PROAIR HFA) inhaler 2 Puff  2 Puff Inhalation Q4H PRN Gar Ponto, MD       ??? allopurinol (ZYLOPRIM) tablet 100 mg  100 mg Oral DAILY Gar Ponto, MD   100 mg at 08/09/16 1010   ??? amLODIPine (NORVASC) tablet 10 mg  10 mg Oral DAILY Gar Ponto, MD   10 mg at 08/09/16 1009   ??? aspirin chewable tablet 81 mg  81 mg Oral DAILY Gar Ponto, MD   Stopped at 08/09/16 0900   ??? atorvastatin (LIPITOR) tablet 10 mg  10 mg Oral QHS Gar Ponto, MD   10 mg at 08/08/16 2322   ??? calcitRIOL (ROCALTROL) capsule 0.25 mcg  0.25 mcg Oral DAILY Gar Ponto, MD   0.25 mcg at 08/09/16 1009   ??? colchicine tablet 0.6 mg  0.6 mg Oral DAILY Gar Ponto, MD   0.6 mg at 08/09/16 1010   ??? famotidine (PEPCID) tablet 20 mg  20 mg Oral BID Gar Ponto, MD   20 mg at 08/09/16 1009   ??? fluticasone-vilanterol (BREO ELLIPTA) 168mcg-25mcg/puff  1 Puff Inhalation DAILY Gar Ponto, MD   1 Puff at 08/09/16 1012   ??? isosorbide dinitrate (ISORDIL) tablet 10 mg  10 mg Oral BID Gar Ponto, MD   10 mg at 08/09/16 1009   ??? umeclidinium (INCRUSE ELLIPTA) 62.5 mcg/actuation  1 Puff Inhalation DAILY Gar Ponto, MD   1 Puff at 08/09/16 1012   ??? traMADol (ULTRAM) tablet 50 mg  50 mg Oral Q6H PRN Gar Ponto, MD   50 mg at 08/09/16 1026   ??? heparin (porcine) injection 5,000 Units  5,000 Units SubCUTAneous Q8H Gar Ponto, MD   5,000 Units at 08/09/16 1437   ??? acetaminophen (TYLENOL) tablet 650 mg  650 mg Oral Q6H PRN Gar Ponto, MD   650 mg at 08/09/16 1335    ??? ondansetron [facility]) injection 4 mg  4 mg IntraVENous Q6H PRN Gar Ponto, MD   4 mg at 08/09/16 0403       Review of Symptoms:  Below symptoms negative if not in [place].  Consitutional Symptoms: Fever, weight loss, weight gain, fatigue  Eyes:  pain, sudden vision loss, blurry vision, double vision             bleeding nose, sore throat, hoarseness  GI: nausea, vomiting, diarrhea, pain, blood in stool  Pulmonary; Cough, Shortness of breath, Wheezing's  Cardiac: chest pain, palpitation  Musculoskeletal: difficulty walking, falls, pain over muscle, joint pain, weakness  GU: dysuria, blood in urine, pain with urination, difficulty voiding  Neurologia: dizziness, syncope, focal weakness, difficulty speaking  Integumentary: rash, redness, ulcer   Psychiatric:  Depression suicidal ideation    Objective:  Visit Vitals   ??? BP 129/60 (BP 1 Location: Right arm, BP Patient Position: At rest)   ??? Pulse 80   ??? Temp 98.6 ??F (37 ??C)   ??? Resp 18   ??? Ht  (1.676 m)   ??? Wt 69.5 kg (153 lb 3.2 oz)   ??? SpO2 97%   ??? Breastfeeding No   ??? BMI 24.73 kg/m2         Well developed and groomed  Eyes: anicteric, no subconjectival hemorrahge, eye lid without lesion  Ears, nose, mouth and throat without visible mass, scars, lesion  Neck: supple, trachea in midline position, thyriod without pain on palpation nor enlargement.  Respiratory: good effort, no rales, good breath sounds, no wheezings  Cardiovascular:  Normal rate, regular rythem normal S1 S2 no rubs or gallops  GI: soft, nontender,  Nomral bowel sound  Musculoskeletal: No clubbing cynosis, no petechia  Skin: no rash, lesion or ulcers  Neruoligcal: no focal dificit grossly  Psychicatric: good judgment  and insights, good memory. No flatt affect. Non anxiouse.          Laboratory Data:  Lab Results   Component Value Date    BUN 9 08/09/2016    BUN 8 08/08/2016    BUN 56 (H) 02/15/2016    NA 142 08/09/2016    NA 142 08/08/2016    NA 143 02/15/2016    CO2 30 08/09/2016     CO2 33 (H) 08/08/2016    CO2 24 02/15/2016     Lab Results   Component Value Date    WBC 8.2 08/09/2016    HGB 8.4 (L) 08/09/2016    HCT 25.8 (L) 08/09/2016     Imaging have been reviewed:  )Ct Head Wo Cont    Result Date: 08/08/2016  EXAM: CT head INDICATION: Blurry vision headaches COMPARISON: None. TECHNIQUE: Axial CT imaging of the head was performed without intravenous contrast. DOSE REDUCTION:  One or more dose reduction techniques were used on this CT: automated exposure control, adjustment of the mAs and/or kVp according to patient's size, and iterative reconstruction techniques. The specific techniques utilized on this CT exam have been documented in the patient's electronic medical record.  FINDINGS: BRAIN AND POSTERIOR FOSSA: There is age-appropriate atrophy. There is no intracranial hemorrhage, mass effect, or midline shift. 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 Old lacunar infarcts are seen in both frontal lobe subcortical white matter and is moderate to significant small vessel ischemic disease. EXTRA-AXIAL SPACES AND MENINGES: There are no abnormal extra-axial fluid collections. CALVARIUM: Intact. SINUSES: Clear. OTHER: None.     IMPRESSION: No acute intracranial abnormalities.    Xr Chest Port    Result Date: 08/08/2016  EXAM: AP portable upright chest INDICATION: Headaches COMPARISON: None.FINDINGS: Heart and mediastinal contours are normal. There are calcified hilar lymph nodes. There are fibrotic changes at the right lung base. No focal consolidation or effusion or pneumothorax seen. There are old bilateral rib fractures.   IMPRESSION: No acute process           )Dulce Martian R Cristie Mckinney, DO,

## 2016-08-09 NOTE — Progress Notes (Signed)
Problem: Falls - Risk of  Goal: *Absence of Falls  Document Schmid Fall Risk and appropriate interventions in the flowsheet.   Outcome: Progressing Towards Goal  Fall Risk Interventions:            Medication Interventions: Evaluate medications/consider consulting pharmacy, Patient to call before getting OOB         History of Falls Interventions: Door open when patient unattended

## 2016-08-09 NOTE — Other (Signed)
0700: Bedside and Verbal shift change report given to T Johnson RN (oncoming nurse) by Alison M Jessamine, RN   (offgoing nurse). Report included the following information SBAR, Kardex, Intake/Output, MAR and Recent Results.

## 2016-08-10 LAB — METABOLIC PANEL, BASIC
Anion gap: 11 mmol/L (ref 3.0–18)
BUN/Creatinine ratio: 3 — ABNORMAL LOW (ref 12–20)
BUN: 26 MG/DL — ABNORMAL HIGH (ref 7.0–18)
CO2: 28 mmol/L (ref 21–32)
Calcium: 8.8 MG/DL (ref 8.5–10.1)
Chloride: 100 mmol/L (ref 100–108)
Creatinine: 7.53 MG/DL — ABNORMAL HIGH (ref 0.6–1.3)
GFR est AA: 7 mL/min/{1.73_m2} — ABNORMAL LOW (ref >60)
GFR est non-AA: 5 mL/min/{1.73_m2} — ABNORMAL LOW (ref >60)
Glucose: 152 mg/dL — ABNORMAL HIGH (ref 74–99)
Potassium: 5 mmol/L (ref 3.5–5.5)
Sodium: 139 mmol/L (ref 136–145)

## 2016-08-10 LAB — HEP B SURFACE AG
Hep B surface Ag Interp.: NEGATIVE
Hepatitis B surface Ag: <0.1 Index (ref <1.00)

## 2016-08-10 MED ORDER — HEPARIN (PORCINE) 1,000 UNIT/ML IJ SOLN
1000 unit/mL | INTRAMUSCULAR | Status: AC
Start: 2016-08-10 — End: 2016-08-10
  Administered 2016-08-10: 16:00:00 via ARTERIOVENOUS_FISTULA

## 2016-08-10 MED ORDER — OXYCODONE-ACETAMINOPHEN 5 MG-325 MG TAB
5-325 mg | ORAL | Status: DC | PRN
Start: 2016-08-10 — End: 2016-08-10
  Administered 2016-08-10: 11:00:00 via ORAL

## 2016-08-10 MED ORDER — LISINOPRIL 20 MG TAB
20 mg | ORAL_TABLET | Freq: Every day | ORAL | 0 refills | Status: DC
Start: 2016-08-10 — End: 2016-08-21

## 2016-08-10 MED FILL — CALCITRIOL 0.25 MCG CAP: 0.25 mcg | ORAL | Qty: 1

## 2016-08-10 MED FILL — ASPIRIN 81 MG CHEWABLE TAB: 81 mg | ORAL | Qty: 1

## 2016-08-10 MED FILL — FAMOTIDINE 20 MG TAB: 20 mg | ORAL | Qty: 1

## 2016-08-10 MED FILL — OXYCODONE-ACETAMINOPHEN 5 MG-325 MG TAB: 5-325 mg | ORAL | Qty: 1

## 2016-08-10 MED FILL — ALLOPURINOL 100 MG TAB: 100 mg | ORAL | Qty: 1

## 2016-08-10 MED FILL — HEPARIN (PORCINE) 1,000 UNIT/ML IJ SOLN: 1000 unit/mL | INTRAMUSCULAR | Qty: 10

## 2016-08-10 MED FILL — HEPARIN (PORCINE) 5,000 UNIT/ML IJ SOLN: 5000 unit/mL | INTRAMUSCULAR | Qty: 1

## 2016-08-10 MED FILL — METOPROLOL TARTRATE 50 MG TAB: 50 mg | ORAL | Qty: 1

## 2016-08-10 MED FILL — COLCHICINE 0.6 MG TAB: 0.6 mg | ORAL | Qty: 1

## 2016-08-10 MED FILL — AMLODIPINE 5 MG TAB: 5 mg | ORAL | Qty: 2

## 2016-08-10 MED FILL — DEXAMETHASONE SODIUM PHOSPHATE 4 MG/ML IJ SOLN: 4 mg/mL | INTRAMUSCULAR | Qty: 1

## 2016-08-10 MED FILL — LISINOPRIL 20 MG TAB: 20 mg | ORAL | Qty: 1

## 2016-08-10 MED FILL — ATORVASTATIN 10 MG TAB: 10 mg | ORAL | Qty: 1

## 2016-08-10 MED FILL — ACETAMINOPHEN 325 MG TABLET: 325 mg | ORAL | Qty: 2

## 2016-08-10 MED FILL — MIRALAX 17 GRAM ORAL POWDER PACKET: 17 gram | ORAL | Qty: 1

## 2016-08-10 NOTE — Discharge Summary (Signed)
Discharge Summary by Velva Harman at 08/10/16 1125                Author: Velva Harman  Service: Internal Medicine  Author Type: Physician Assistant       Filed: 08/10/16 1132  Date of Service: 08/10/16 1125  Status: Attested           Editor: Velva Harman  Cosigner: Gar Ponto, MD at 08/10/16 1250          Attestation signed by Gar Ponto, MD at 08/10/16 1250          I was personally available for consultation on this patient. I have independently seen and examined the patient. I agree with the assessment and treatment plan  as documented.   She is doing better, BP has come down nicely, HA is much more tolerable.   We discussed her meds, and she saw Dr Tasia Catchings as well, who reviewed her meds, and we feel she is stable for d/c home, as does she. She prefers to dialyze here and if doing well therafter will d/c home with close follow up.   I also reviewed her cath findings with her and we discussed  That also at her request.                                       Discharge Summary          Patient: Tracey King  MRN: [id number]   CSN: [id number]          Date of Birth: 31-Oct-1950   Age: 66 y.o.   Sex: female      DOA: 08/08/2016  LOS:  LOS: 2 days    Discharge Date:         Primary Care Provider:  Delena Bali, MD      Admission Diagnoses: Chest discomfort   Elevated troponin   Blurred vision   Headache      Discharge Diagnoses:        Problem List as of 08/10/2016   Date Reviewed:  06/20/2016                Codes  Class  Noted - Resolved             Blurred vision  ICD-10-CM: H53.8   ICD-9-CM: 368.8    08/08/2016 - Present                       * (Principal)Chest discomfort  ICD-10-CM: R07.89   ICD-9-CM: 786.59    08/08/2016 - Present                       Elevated troponin  ICD-10-CM: R74.8   ICD-9-CM: 790.6    08/08/2016 - Present                       Headache  ICD-10-CM: R51   ICD-9-CM: 784.0    08/08/2016 - Present                       ESRD on hemodialysis (HCC)  ICD-10-CM: N18.6, Z99.2    ICD-9-CM: 585.6, V45.11    04/03/2016 - Present  Chronic kidney disease  ICD-10-CM: N18.9   ICD-9-CM: 585.9    12/26/2015 - Present                          Discharge Medications:        Current Discharge Medication List              START taking these medications          Details        lisinopril (PRINIVIL, ZESTRIL) 20 mg tablet  Take 1 Tab by mouth daily.   Qty: 30 Tab, Refills:  0                     CONTINUE these medications which have NOT CHANGED          Details        famotidine (PEPCID) 20 mg tablet  Take 20 mg by mouth two (2) times a day.               allopurinol (ZYLOPRIM) 100 mg tablet  Take  by mouth daily.               atorvastatin (LIPITOR) 10 mg tablet  Take 10 mg by mouth nightly.               isosorbide dinitrate (ISORDIL) 40 mg tablet  Take 10 mg by mouth two (2) times a day.               albuterol (PROVENTIL HFA, VENTOLIN HFA, PROAIR HFA) 90 mcg/actuation inhaler  Take 2 Puffs by inhalation every four (4) hours as needed for Wheezing.               aspirin 81 mg chewable tablet  Take 81 mg by mouth daily.               calcitRIOL (ROCALTROL) 0.25 mcg capsule  Take 0.25 mcg by mouth daily.               colchicine 0.6 mg tablet  Take 0.6 mg by mouth daily.               metoprolol succinate (TOPROL-XL) 25 mg XL tablet  Take  by mouth daily.               amLODIPine (NORVASC) 10 mg tablet  Take  by mouth daily.               traMADol (ULTRAM) 50 mg tablet  Take 50 mg by mouth every six (6) hours as needed for Pain.               folic acid 1 mg tab 0.5 mg, multivitamin, stress formula tab 1 Tab  Take  by mouth daily.               oxyCODONE-acetaminophen (PERCOCET 7.5) 7.5-325 mg per tablet  Take 2 Tabs by mouth every six (6) hours as needed for Pain. Max Daily Amount: 8 Tabs.   Qty: 42 Tab, Refills:  0               tiotropium bromide (SPIRIVA RESPIMAT) 2.5 mcg/actuation inhaler  Take 2 Puffs by inhalation daily.               fluticasone-salmeterol (ADVAIR) 100-50  mcg/dose diskus inhaler  Take 1 Puff by inhalation every twelve (12) hours.  Polyethylene Glycol 3350 powd  by Does Not Apply route.                         Discharge Condition: Stable      Procedures : None      Consults: Cardiology and Nephrology         PHYSICAL EXAM       Visit Vitals         ?  BP  139/66 (BP 1 Location: Right arm, BP Patient Position: At rest;Sitting)     ?  Pulse  92     ?  Temp  98.6 ??F (37 ??C)     ?  Resp  14     ?  Ht   (1.676 m)     ?  Wt  68.7 kg (151 lb 6.4 oz)     ?  SpO2  100%     ?  Breastfeeding  No         ?  BMI  24.44 kg/m2        General: Awake, cooperative, no acute distress????   HEENT: NC, Atraumatic.  PERRLA, EOMI. Anicteric sclerae.   Lungs:  CTA Bilaterally. No Wheezing/Rhonchi/Rales.   Heart:  Regular  rhythm,?? No murmur, No Rubs, No Gallops   Abdomen: Soft, Non distended, Non tender. +Bowel sounds,    Extremities: No c/c/e   Psych:???? Not anxious or agitated.   Neurologic:?? No acute neurological deficits.                                         Admission HPI :  The patient had a cardiac cath 2 days ago on 04/03.  She had mild to moderate coronary disease discovered.  No stents were placed.  She tolerated the procedure well and was discharged  home.  She went to dialysis today and was not feeling very well.  Apparently yesterday, she says she does not remember much of the day, but she had a headache today, just felt pain all over her head.  She apparently had some blurred vision associated.   She really could not get relief from the headache, even with Advil, and so she came into the emergency room.  Her troponin was mildly elevated.  She had no chest pain.  She describes dyspnea on exertion over the past at least few months that has  been going on, and thus she has been evaluated by her pulmonologist, and now subsequently by the cardiologist, but ultimately, she felt poorly enough that they recommended admission to the hospital for cardiology evaluation  too.  She feels like she has  had some type of reaction to something that was given during her cardiac cath at this point, which was now 48 hours ago.      Hospital Course : This patient was admitted to medical services on August 09, 2016 for a headache secondary to uncontrolled hypertension. Her hypertension was under better control  upon admission, as was her headache. She did continue to complain of intermittent headache pain, but received a one time dose of valproate which seemed to alleviate her pain a decent amount. She was concerned about discharging to go to dialysis, as that  appears to be what caused her symptoms that prompted her ED visit, so she remained hospitalized for dialysis to monitor for any hypertension or  worsening of her headache. Once dialysis concludes, the patient may be discharged home with outpatient follow  up with her PCP, Dr Cathlean Cower, and may resume her usual HD schedule on Tuesday.       Activity: Activity as tolerated      Diet: Renal Diet      Follow-up: This patient was instructed to follow up with her PCP upon discharge.      Disposition: This patient will be discharged home in stable condition.       Minutes spent on discharge: 25             Labs:  Results:                  Chemistry  Recent Labs          08/10/16    0519   08/09/16    0105   08/08/16    2107      GLU   152*   105*   104*      NA   139   142   142      K   5.0   3.8   3.0*      CL   100   102   99*      CO2   28   30   33*      BUN   26*   9   8      CREA   7.53*   4.28*   3.74*      CA   8.8   8.6   9.1      AGAP   BUCR   3*   2*   2*      AP    --     --    90      TP    --     --    7.3      ALB    --     --    3.5      GLOB    --     --    3.8      AGRAT    --     --    0.9              CBC w/Diff  Recent Labs          08/09/16    0105   08/08/16    2107      WBC   8.2   9.1      RBC   3.01*   3.51*      HGB   8.4*   9.8*      HCT   25.8*   30.0*      PLT   166   169      GRANS    --    77*       LYMPH    --    15*      EOS    --    3           Cardiac Enzymes  Recent Labs          08/09/16    0700   08/09/16    0105      CPK   120   123      CKND1  CALCULATION NOT PERFORMED WHEN RESULT IS BELOW LINEAR LIMIT   CALCULATION NOT PERFORMED WHEN RESULT IS BELOW LINEAR LIMIT           Coagulation  Recent Labs          08/08/16    2107      PTP   12.4      INR   1.0      APTT   36.2               Lipid Panel  No results found for: CHOL, CHOLPOCT, CHOLX, CHLST, CHOLV, 884269, HDL, LDL, LDLC, DLDLP, 161096, VLDLC, VLDL, TGLX, TRIGL, TRIGP, TGLPOCT, CHHD, CHHDX     BNP  No results for input(s): BNPP in the last 72 hours.        Liver Enzymes  Recent Labs          08/08/16    2107      TP   7.3      ALB   3.5      AP   90      SGOT   17                 Thyroid Studies  No results found for: T4, T3U, TSH, TSHEXT             Significant Diagnostic Studies: Ct Head Wo Cont      Result Date: 08/08/2016   EXAM: CT head INDICATION: Blurry vision headaches COMPARISON: None. TECHNIQUE: Axial CT imaging of the head was performed without intravenous contrast. DOSE REDUCTION:  One or more dose reduction techniques were used on this CT: automated exposure control,  adjustment of the mAs and/or kVp according to patient's size, and iterative reconstruction techniques. The specific techniques utilized on this CT exam have been documented in the patient's electronic medical record. _______________ FINDINGS: BRAIN AND  POSTERIOR FOSSA: There is age-appropriate atrophy. There is no intracranial hemorrhage, mass effect, or midline shift. 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 Old lacunar infarcts are seen in both frontal lobe subcortical  white matter and is moderate to significant small vessel ischemic disease. EXTRA-AXIAL SPACES AND MENINGES: There are no abnormal extra-axial fluid collections. CALVARIUM: Intact. SINUSES: Clear. OTHER: None. _______________       IMPRESSION: No acute intracranial  abnormalities.      Xr Chest Port      Result Date: 08/08/2016   EXAM: AP portable upright chest INDICATION: Headaches COMPARISON: None. _______________ FINDINGS: Heart and mediastinal contours are normal. There are calcified hilar lymph nodes. There are fibrotic changes at the right lung base. No focal consolidation  or effusion or pneumothorax seen. There are old bilateral rib fractures. _______________       IMPRESSION: No acute process             No results found for this or any previous visit.            CC: Delena Bali, MD

## 2016-08-10 NOTE — Progress Notes (Signed)
Shift summary:  Patient stated continues to have HA.  Offered patient Tylenol, but patient stated has not helped.  Ultram also offered, but patient declined stating that Ultram made her hot with burning sensation.  Dr. Langley Gauss notified and Percocet 500/325 ordered po.  Will monitor.  Vital signs WNL, NSR with peak T waves. Anticipating discharge today.  Patient stated has dialysis appointment at noon.      Patient Vitals for the past 12 hrs:   Temp Pulse Resp BP SpO2   08/10/16 0332 98.4 ??F (36.9 ??C) 80 14 142/78 98 %   08/09/16 2338 98.5 ??F (36.9 ??C) 73 16 123/66 96 %   08/09/16 2147 - - - 136/72 -   08/09/16 1948 98.4 ??F (36.9 ??C) 81 17 133/72 98 %       Bedside and Verbal shift change report given to M.Ngure,RN (Cabin crew) by Skip Estimable (offgoing nurse). Report included the following information SBAR, Kardex, Intake/Output and MAR.

## 2016-08-10 NOTE — Progress Notes (Signed)
Problem List :    ESRD  Chest discomfort   CKD  Blurred vision  Elev troponin  Anemia    Assessment and Plan :    ESRD cont HD another treatment today   Anemia check iron stdies con tinue ESA   ?? Hyperphosphatemia Check phos in am   HTN cont amlopdipine / ACE / betablocker   Hyperparathyroid continie Calctriol         Subjective :     ?? No new complaints   ?? No chest pain or SOB   ?? No nausea vomiting or diarrhea  ?? No fever or chills    Objective :     Visit Vitals   ??? BP 139/66 (BP 1 Location: Right arm, BP Patient Position: At rest;Sitting)   ??? Pulse 92   ??? Temp 98.6 ??F (37 ??C)   ??? Resp 14   ??? Ht  (1.676 m)   ??? Wt 68.7 kg (151 lb 6.4 oz)   ??? SpO2 100%   ??? Breastfeeding No   ??? BMI 24.44 kg/m2       ?? General : No apparent distress.   ?? HEENT : Normocephalic and atraumatic  ?? Lungs : Clear to auscultation bilaterally  ?? Cardiovascular : Regular rate and rhythm, no rubs or gallops  ?? Abdomen : Soft , nontender, nondistended with positive bowel sounds.  ?? Extremities : No lower extremity edema    Laboratory and imaging data:     Pertinent laboratory testing and imaging data reviewed    Checklist    Code status :  No Order      Diet :    DIET RENAL Regular    Treatment Team :  Treatment Team: Attending Provider: Gar Ponto, MD; Consulting Provider: Asa Saunas, MD; Consulting Provider: Gar Ponto, MD; Consulting Provider: Hyman Bible, DO; Utilization Review: Christiane Ha    Medications :   Current Medications  :     Current Facility-Administered Medications   Medication Dose Route Frequency Provider Last Rate Last Dose   ??? oxyCODONE-acetaminophen (PERCOCET) 5-325 mg per tablet 1 Tab  1 Tab Oral Q4H PRN Sallye Ober, MD   1 Tab at 08/10/16 0706   ??? bisacodyl (DULCOLAX) suppository 10 mg  10 mg Rectal DAILY PRN Gar Ponto, MD   10 mg at 08/09/16 1135   ??? polyethylene glycol (MIRALAX) packet 17 g  17 g Oral DAILY Gar Ponto, MD   17 g at 08/09/16 1135    ??? morphine injection 2 mg  2 mg IntraVENous Q4H PRN Gar Ponto, MD   2 mg at 08/09/16 1433   ??? lisinopril (PRINIVIL, ZESTRIL) tablet 20 mg  20 mg Oral DAILY Ahmed R Amini, DO   20 mg at 08/09/16 1647   ??? colchicine tablet 0.3 mg  0.3 mg Oral DAILY Ahmed R Amini, DO       ??? famotidine (PEPCID) tablet 20 mg  20 mg Oral DAILY Gar Ponto, MD   20 mg at 08/10/16 0901   ??? heparin (porcine) 1,000 unit/mL injection 4,000 Units  4,000 Units Hemodialysis Q TUE, THU & SAT Ahmed R Amini, DO   4,000 Units at 08/10/16 1139   ??? metoprolol tartrate (LOPRESSOR) tablet 50 mg  50 mg Oral BID Gar Ponto, MD   50 mg at 08/09/16 2149   ??? albuterol (PROVENTIL HFA, VENTOLIN HFA, PROAIR HFA) inhaler 2 Puff  2 Puff Inhalation Q4H  PRN Gar Ponto, MD       ??? allopurinol (ZYLOPRIM) tablet 100 mg  100 mg Oral DAILY Gar Ponto, MD   100 mg at 08/09/16 1010   ??? amLODIPine (NORVASC) tablet 10 mg  10 mg Oral DAILY Gar Ponto, MD   10 mg at 08/09/16 1009   ??? aspirin chewable tablet 81 mg  81 mg Oral DAILY Gar Ponto, MD   Stopped at 08/09/16 0900   ??? atorvastatin (LIPITOR) tablet 10 mg  10 mg Oral QHS Gar Ponto, MD   10 mg at 08/09/16 2143   ??? calcitRIOL (ROCALTROL) capsule 0.25 mcg  0.25 mcg Oral DAILY Gar Ponto, MD   0.25 mcg at 08/09/16 1009   ??? fluticasone-vilanterol (BREO ELLIPTA) 158mcg-25mcg/puff  1 Puff Inhalation DAILY Gar Ponto, MD   1 Puff at 08/09/16 1012   ??? umeclidinium (INCRUSE ELLIPTA) 62.5 mcg/actuation  1 Puff Inhalation DAILY Gar Ponto, MD   1 Puff at 08/09/16 1012   ??? traMADol (ULTRAM) tablet 50 mg  50 mg Oral Q6H PRN Gar Ponto, MD   50 mg at 08/09/16 1026   ??? heparin (porcine) injection 5,000 Units  5,000 Units SubCUTAneous Q8H Gar Ponto, MD   5,000 Units at 08/10/16 0647   ??? acetaminophen (TYLENOL) tablet 650 mg  650 mg Oral Q6H PRN Gar Ponto, MD   650 mg at 08/09/16 2143   ??? ondansetron (ZOFRAN) injection 4 mg  4 mg IntraVENous Q6H PRN Gar Ponto, MD   4 mg at 08/09/16 0403

## 2016-08-10 NOTE — Progress Notes (Addendum)
Pt tolerated HD without any issues per HD RN.Dual AVS reviewed with Jiali,RN.  All medications reviewed individually with patient.  Opportunities for questions and concerns provided.  Patient discharged via [facility] to D/C Main entrance  Patient's arm band and IV removed and  appropriately discarded.

## 2016-08-10 NOTE — Discharge Summary (Signed)
Discharge Summary    Patient: Tracey King MRN: [id number]  CSN: [id number]    Date of Birth: [birth year]  Age: 66 y.o.  Sex: female    DOA: 08/08/2016 LOS:  LOS: 2 days   Discharge Date:      Primary Care Provider:  Delena Bali, MD    Admission Diagnoses: Chest discomfort  Elevated troponin  Blurred vision  Headache    Discharge Diagnoses:    Problem List as of 08/10/2016  Date Reviewed: 2016/06/14          Codes Class Noted - Resolved    Blurred vision ICD-10-CM: H53.8  ICD-9-CM: 368.8  08/08/2016 - Present        * (Principal)Chest discomfort ICD-10-CM: R07.89  ICD-9-CM: 786.59  08/08/2016 - Present        Elevated troponin ICD-10-CM: R74.8  ICD-9-CM: 790.6  08/08/2016 - Present        Headache ICD-10-CM: R51  ICD-9-CM: 784.0  08/08/2016 - Present        ESRD on hemodialysis [facility]) ICD-10-CM: N18.6, Z99.2  ICD-9-CM: 585.6, V45.11  04/03/2016 - Present        Chronic kidney disease ICD-10-CM: N18.9  ICD-9-CM: 585.9  12/26/2015 - Present              Discharge Medications:     Current Discharge Medication List      START taking these medications    Details   lisinopril (PRINIVIL, ZESTRIL) 20 mg tablet Take 1 Tab by mouth daily.  Qty: 30 Tab, Refills: 0         CONTINUE these medications which have NOT CHANGED    Details   famotidine (PEPCID) 20 mg tablet Take 20 mg by mouth two (2) times a day.      allopurinol (ZYLOPRIM) 100 mg tablet Take  by mouth daily.      atorvastatin (LIPITOR) 10 mg tablet Take 10 mg by mouth nightly.      isosorbide dinitrate (ISORDIL) 40 mg tablet Take 10 mg by mouth two (2) times a day.      albuterol (PROVENTIL HFA, VENTOLIN HFA, PROAIR HFA) 90 mcg/actuation inhaler Take 2 Puffs by inhalation every four (4) hours as needed for Wheezing.      aspirin 81 mg chewable tablet Take 81 mg by mouth daily.      calcitRIOL (ROCALTROL) 0.25 mcg capsule Take 0.25 mcg by mouth daily.      colchicine 0.6 mg tablet Take 0.6 mg by mouth daily.       metoprolol succinate (TOPROL-XL) 25 mg XL tablet Take  by mouth daily.      amLODIPine (NORVASC) 10 mg tablet Take  by mouth daily.      traMADol (ULTRAM) 50 mg tablet Take 50 mg by mouth every six (6) hours as needed for Pain.      folic acid 1 mg tab 0.5 mg, multivitamin, stress formula tab 1 Tab Take  by mouth daily.      oxyCODONE-acetaminophen (PERCOCET 7.5) 7.5-325 mg per tablet Take 2 Tabs by mouth every six (6) hours as needed for Pain. Max Daily Amount: 8 Tabs.  Qty: 42 Tab, Refills: 0      tiotropium bromide (SPIRIVA RESPIMAT) 2.5 mcg/actuation inhaler Take 2 Puffs by inhalation daily.      fluticasone-salmeterol (ADVAIR) 100-50 mcg/dose diskus inhaler Take 1 Puff by inhalation every twelve (12) hours.      Polyethylene Glycol 3350 powd by Does Not Apply route.  Discharge Condition: Stable    Procedures : None    Consults: Cardiology and Nephrology      PHYSICAL EXAM    Visit Vitals   ??? BP 139/66 (BP 1 Location: Right arm, BP Patient Position: At rest;Sitting)   ??? Pulse 92   ??? Temp 98.6 ??F (37 ??C)   ??? Resp 14   ??? Ht 5\' 6"  (1.676 m)   ??? Wt 68.7 kg (151 lb 6.4 oz)   ??? SpO2 100%   ??? Breastfeeding No   ??? BMI 24.44 kg/m2     General: Awake, cooperative, no acute distress????  HEENT: NC, Atraumatic.  PERRLA, EOMI. Anicteric sclerae.  Lungs:  CTA Bilaterally. No Wheezing/Rhonchi/Rales.  Heart:  Regular  rhythm,?? No murmur, No Rubs, No Gallops  Abdomen: Soft, Non distended, Non tender. +Bowel sounds,   Extremities: No c/c/e  Psych:???? Not anxious or agitated.  Neurologic:?? No acute neurological deficits.                                     Admission HPI :  The patient had a cardiac cath 2 days ago on 04/03.  She had mild to moderate coronary disease discovered.  No stents were placed.  She tolerated the procedure well and was discharged home.  She went to dialysis today and was not feeling very well.  Apparently yesterday, she says she does not remember much of the day, but she had a headache today,  just felt pain all over her head.  She apparently had some blurred vision associated.  She really could not get relief from the headache, even with Advil, and so she came into the emergency room.  Her troponin was mildly elevated.  She had no chest pain.  She describes dyspnea on exertion over the past at least few months that has been going on, and thus she has been evaluated by her pulmonologist, and now subsequently by the cardiologist, but ultimately, she felt poorly enough that they recommended admission to the hospital for cardiology evaluation too.  She feels like she has had some type of reaction to something that was given during her cardiac cath at this point, which was now 48 hours ago.    Hospital Course : This patient was admitted to medical services on August 09, 2016 for a headache secondary to uncontrolled hypertension. Her hypertension was under better control upon admission, as was her headache. She did continue to complain of intermittent headache pain, but received a one time dose of valproate which seemed to alleviate her pain a decent amount. She was concerned about discharging to go to dialysis, as that appears to be what caused her symptoms that prompted her ED visit, so she remained hospitalized for dialysis to monitor for any hypertension or worsening of her headache. Once dialysis concludes, the patient may be discharged home with outpatient follow up with her PCP, Dr Cathlean Cower, and may resume her usual HD schedule on Tuesday.     Activity: Activity as tolerated    Diet: Renal Diet    Follow-up: This patient was instructed to follow up with her PCP upon discharge.    Disposition: This patient will be discharged home in stable condition.     Minutes spent on discharge: 25       Labs: Results:       Chemistry Recent Labs      08/10/16   [phone]  08/09/16   0105  08/08/16   2107   GLU  152*  105*  104*   NA  139  142  142   K  5.0  3.8  3.0*   CL  100  102  99*   CO2  28  30  33*   BUN  26*  9  8    CREA  7.53*  4.28*  3.74*   CA  8.8  8.6  9.1   AGAP  BUCR  3*  2*  2*   AP   --    --   90   TP   --    --   7.3   ALB   --    --   3.5   GLOB   --    --   3.8   AGRAT   --    --   0.9      CBC w/Diff Recent Labs      08/09/16   0105  08/08/16   2107   WBC  8.2  9.1   RBC  3.01*  3.51*   HGB  8.4*  9.8*   HCT  25.8*  30.0*   PLT  166  169   GRANS   --   77*   LYMPH   --   15*   EOS   --   3      Cardiac Enzymes Recent Labs      08/09/16   0700  08/09/16   0105   CPK  120  123   CKND1  CALCULATION NOT PERFORMED WHEN RESULT IS BELOW LINEAR LIMIT  CALCULATION NOT PERFORMED WHEN RESULT IS BELOW LINEAR LIMIT      Coagulation Recent Labs      08/08/16   2107   PTP  12.4   INR  1.0   APTT  36.2       Lipid Panel No results found for: CHOL, CHOLPOCT, CHOLX, CHLST, CHOLV, 884269, HDL, LDL, LDLC, DLDLP, 804564, VLDLC, VLDL, TGLX, TRIGL, TRIGP, TGLPOCT, CHHD, CHHDX   BNP No results for input(s): BNPP in the last 72 hours.   Liver Enzymes Recent Labs      08/08/16   2107   TP  7.3   ALB  3.5   AP  90   SGOT  17      Thyroid Studies No results found for: T4, T3U, TSH, TSHEXT         Significant Diagnostic Studies: Ct Head Wo Cont    Result Date: 08/08/2016  EXAM: CT head INDICATION: Blurry vision headaches COMPARISON: None. TECHNIQUE: Axial CT imaging of the head was performed without intravenous contrast. DOSE REDUCTION:  One or more dose reduction techniques were used on this CT: automated exposure control, adjustment of the mAs and/or kVp according to patient's size, and iterative reconstruction techniques. The specific techniques utilized on this CT exam have been documented in the patient's electronic medical record. _______________ FINDINGS: BRAIN AND POSTERIOR FOSSA: There is age-appropriate atrophy. There is no intracranial hemorrhage, mass effect, or midline shift. 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 Old lacunar  infarcts are seen in both frontal lobe subcortical white matter and is moderate to significant small vessel ischemic disease. EXTRA-AXIAL SPACES AND MENINGES: There are no abnormal extra-axial fluid collections. CALVARIUM: Intact. SINUSES: Clear. OTHER: None. _______________     IMPRESSION: No  acute intracranial abnormalities.    Xr Chest Port    Result Date: 08/08/2016  EXAM: AP portable upright chest INDICATION: Headaches COMPARISON: None. _______________ FINDINGS: Heart and mediastinal contours are normal. There are calcified hilar lymph nodes. There are fibrotic changes at the right lung base. No focal consolidation or effusion or pneumothorax seen. There are old bilateral rib fractures. _______________     IMPRESSION: No acute process         No results found for this or any previous visit.        CC: Delena Bali, MD

## 2016-08-11 LAB — EKG, 12 LEAD, INITIAL
Atrial Rate: 102 {beats}/min
Calculated P Axis: 67 degrees
Calculated R Axis: 76 degrees
Calculated T Axis: 75 degrees
P-R Interval: 196 ms
Q-T Interval: 346 ms
QRS Duration: 92 ms
QTC Calculation (Bezet): 450 ms
Ventricular Rate: 102 {beats}/min

## 2016-08-11 LAB — HEPATITIS B CORE AB, TOTAL: Hep B Core Ab, total: POSITIVE — AB

## 2016-08-11 LAB — EKG 12-LEAD
Atrial Rate: 102 {beats}/min
P Axis: 67 degrees
P-R Interval: 196 ms
Q-T Interval: 346 ms
QRS Duration: 92 ms
QTc Calculation (Bazett): 450 ms
R Axis: 76 degrees
T Axis: 75 degrees
Ventricular Rate: 102 {beats}/min

## 2016-08-11 LAB — HEPATITIS B CORE ANTIBODY, TOTAL: Hep B Core Total Ab: POSITIVE — AB

## 2016-08-21 ENCOUNTER — Emergency Department: Admit: 2016-08-21 | Payer: MEDICARE | Primary: Internal Medicine

## 2016-08-21 ENCOUNTER — Inpatient Hospital Stay
Admit: 2016-08-21 | Discharge: 2016-08-21 | Disposition: A | Discharge status: Home / Self Care | Payer: MEDICARE | Attending: Emergency Medicine

## 2016-08-21 DIAGNOSIS — S0003XA Contusion of scalp, initial encounter: Secondary | ICD-10-CM

## 2016-08-21 LAB — HEPATITIS B QT, BY PCR WITH GRAPH: HBV IU/mL: NOT DETECTED IU/mL

## 2016-08-21 MED ORDER — KETOROLAC TROMETHAMINE 15 MG/ML INJECTION
15 mg/mL | INTRAMUSCULAR | Status: DC
Start: 2016-08-21 — End: 2016-08-21

## 2016-08-21 NOTE — ED Triage Notes (Addendum)
C/o tripping and falling this morning, pt hit her head on hardwood floor, noted to have large amount swelling to left side of forehead, bilateral knee pain, left eye pain and left low back pain, states she saw "stars" when she hit her head. Unknown LOC.  Sepsis Screening completed    (  )Patient meets SIRS criteria.  (x  )Patient does not meet SIRS criteria.      SIRS Criteria is achieved when two or more of the following are present  ? Temperature < 96.8??F (36??C) or > 100.9??F (38.3??C)  ? Heart Rate > 90 beats per minute  ? Respiratory Rate > 20 breaths per minute  ? WBC count > 12,000 or <4,000 or > 10% bands

## 2016-08-21 NOTE — ED Provider Notes (Signed)
EMERGENCY DEPARTMENT HISTORY AND PHYSICAL EXAM    Date: 08/21/2016  Patient Name: Tracey King    History of Presenting Illness     Chief Complaint   Patient presents with   ??? Fall         History Provided By: Patient    Chief Complaint: fall  Duration: this morning  Timing:  Acute  Quality: mechanical  Associated Symptoms: left forehead swelling, knee pain, lower back pain, nausea    Additional History (Context):   10:03 AM   Tracey King is a 66 y.o. female who presents to the emergency department for evaluation s/p mechanical ground-level fall forward and hitting her left forehead on hardwood floor this morning. Associated sxs include swelling to the left forehead, bilateral knee pain, left lower back pain, and nausea. Pt unsure of LOC. Pt denies dizziness, neck pain, vomiting, and any other sxs or complaints.     PCP: Delena Bali, MD    Current Facility-Administered Medications   Medication Dose Route Frequency Provider Last Rate Last Dose   ??? ketorolac (TORADOL) injection 15 mg  15 mg IntraVENous NOW Denny Levy, PA         Current Outpatient Prescriptions   Medication Sig Dispense Refill   ??? famotidine (PEPCID) 20 mg tablet Take 20 mg by mouth two (2) times a day.     ??? allopurinol (ZYLOPRIM) 100 mg tablet Take  by mouth daily.     ??? atorvastatin (LIPITOR) 10 mg tablet Take 10 mg by mouth nightly.     ??? traMADol (ULTRAM) 50 mg tablet Take 50 mg by mouth every six (6) hours as needed for Pain.     ??? folic acid 1 mg tab 0.5 mg, multivitamin, stress formula tab 1 Tab Take  by mouth daily.     ??? isosorbide dinitrate (ISORDIL) 40 mg tablet Take 10 mg by mouth two (2) times a day.     ??? albuterol (PROVENTIL HFA, VENTOLIN HFA, PROAIR HFA) 90 mcg/actuation inhaler Take 2 Puffs by inhalation every four (4) hours as needed for Wheezing.     ??? tiotropium bromide (SPIRIVA RESPIMAT) 2.5 mcg/actuation inhaler Take 2 Puffs by inhalation daily.     ??? aspirin 81 mg chewable tablet Take 81 mg by mouth daily.      ??? calcitRIOL (ROCALTROL) 0.25 mcg capsule Take 0.25 mcg by mouth daily.     ??? colchicine 0.6 mg tablet Take 0.6 mg by mouth daily.     ??? fluticasone-salmeterol (ADVAIR) 100-50 mcg/dose diskus inhaler Take 1 Puff by inhalation every twelve (12) hours.     ??? metoprolol succinate (TOPROL-XL) 25 mg XL tablet Take  by mouth daily.     ??? Polyethylene Glycol 3350 powd by Does Not Apply route.     ??? amLODIPine (NORVASC) 10 mg tablet Take  by mouth daily.         Past History     Past Medical History:  Past Medical History:   Diagnosis Date   ??? Arrhythmia     intermittent "racing heart"   ??? Chronic kidney disease     ESRD, Stage IV   ??? Chronic obstructive pulmonary disease (HCC)    ??? Dialysis patient [facility])    ??? GERD (gastroesophageal reflux disease)    ??? Hypertension    ??? Rheumatic fever 1970's   ??? Sarcoidosis        Past Surgical History:  Past Surgical History:   Procedure Laterality Date   ???  HX LAP CHOLECYSTECTOMY     ??? HX TAH AND BSO     ??? HX VASCULAR ACCESS         Family History:  Family History   Problem Relation Age of Onset   ??? Heart Attack Father        Social History:  Social History   Substance Use Topics   ??? Smoking status: Former Smoker     Quit date: 05/06/1968   ??? Smokeless tobacco: Never Used   ??? Alcohol use No       Allergies:  Allergies   Allergen Reactions   ??? Erythromycin Nausea and Vomiting   ??? Reglan [Metoclopramide Hcl] Anxiety         Review of Systems   Review of Systems  Constitutional:  Denies malaise, fever, chills.   Head:  Swelling to left forehead  Face:  Denies injury or pain.   ENMT:  Denies sore throat.   Neck:  Denies injury or pain.   Chest:  Denies injury.   Cardiac:  Denies chest pain or palpitations.   Respiratory:  Denies cough, wheezing, difficulty breathing, shortness of breath.   GI/ABD: Positive for nausea. Denies injury, pain, distention, vomiting, diarrhea.   GU:  Denies injury, pain, dysuria or urgency.   Back:  Positive left lower back pain.  Pelvis:  Denies injury or pain.    Extremity/MS:  Bilateral knee pain  Neuro:  Possible LOC. Denies headache, dizziness, neurologic symptoms/deficits/paresthesias.   Skin: Denies rash, itching.    Physical Exam     Vitals:    08/21/16 0958 08/21/16 1110   BP: 190/83 168/78   Pulse: (!) 101 99   Resp: 16 15   Temp: 98.2 ??F (36.8 ??C)    SpO2: 100% 99%   Weight: 63.5 kg (140 lb)    Height:  (1.676 m)      Physical Exam  CONSTITUTIONAL: Alert, in no apparent distress; well-developed; well-nourished.   HEAD:  Normocephalic.Large hematoma to left forehead.   EYES: PERRL; EOM's intact. No nystagmus  ENTM: Nose: No rhinorrhea; Throat: mucous membranes moist. Posterior pharynx-normal.  Neck:  No JVD, supple without lymphadenopathy. No point tenderness along cervical spine  RESP: Chest clear, equal breath sounds.   CV: S1 and S2 WNL; No murmurs, gallops or rubs.   GI: Abdomen soft and non-tender. No masses or organomegaly.   UPPER EXT:  Normal inspection.   LOWER EXT: Normal inspection.  NEURO: CN 3-12 grossly intact, no pronator drift. Strength 5/5 and sym, sensation intact.   SKIN: No rashes; Normal for age and stage.   PSYCH:  Alert and oriented, normal affect.      Diagnostic Study Results     Labs -   No results found for this or any previous visit (from the past 12 hour(s)).    Radiologic Studies -   CT Results  (Last 48 hours)               08/21/16 1107  CT HEAD WO CONT Final result    Impression:  IMPRESSION:       1. Left frontal scalp hematoma without evidence of underlying fracture.       2. No acute intracranial hemorrhage, mass effect, midline shift, or herniation.            3. Stable, very mild nonspecific white matter disease likely representing   chronic small vessel changes.        4. Stable suspected bilateral frontal  deep hemispheric white matter chronic   infarcts. No definite CT evidence of acute cortical infarct is seen.  Please   note that noncontrast head CT may be normal in early acute infarct.        5. Stable very mild right ethmoidal sinus disease without air-fluid level.       Narrative:  EXAM: CT head       INDICATION: Fall, head injury, left forehead swelling       COMPARISON: CT head 08/08/2016       TECHNIQUE: Axial CT imaging of the head was performed without intravenous   contrast.  Additional coronal and sagittal reconstructions were performed. One   or more dose reduction techniques were used on this CT: automated exposure   control, adjustment of the mAs and/or kVp according to patient's size, and   iterative reconstruction techniques. The specific techniques utilized on this CT   exam have been documented in the patient's electronic medical record.   _______________       FINDINGS:       VENTRICLES/EXTRA-AXIAL SPACES: The ventricles and sulci are within normal limits   for patient's stated age in their size and configuration.       BRAIN PARENCHYMA: There is no evidence of acute intracranial hemorrhage, mass   effect, midline shift, or herniation. No definite CT evidence of acute cortical   infarct is seen. A very mild amount of hypodense white matter lesions are seen   within the periventricular and subcortical white matter which are nonspecific,   but likely represent chronic small vessel changes. More discrete hypodensities   are seen involving the anterior bifrontal white matter, unchanged which may   represent chronic infarcts.       OSSEOUS STRUCTURES: Localized left frontal scalp soft tissue swelling   hyperdensity is present consistent with scalp hematoma. No underlying calvarial   fracture is seen.       PARANASAL SINUSES/MASTOIDS: Soft tissue density is seen filling the right   ethmoidal L sella which also contains some internal calcific density perhaps   conclusions within inspissated mucus. No air-fluid levels are seen. Visualized   mastoid air cells are clear.       ORBITS: The visualized orbits are unremarkable.       OTHER: None.         _______________            08/21/16 1104  CT SPINE CERV WO CONT Final result    Impression:  IMPRESSION:       No cervical spine fracture or subluxation.       Multilevel degenerative changes scattered throughout the cervical spine , as   described in detail in Findings section. . Mild multilevel central stenosis at   C3-4 through C6-7 levels, greatest at C3-4.       No definite high-grade central or foraminal stenosis.       Please note this cervical spine CT was performed with patient supine.  This   supine study does not evaluate for ligamentous injury or exclude instability.    If the patient has persistent symptoms or if otherwise clinically indicated,   erect cervical spine plain films are recommended.           Narrative:  EXAM: CT Cervical spine       INDICATION: Neck pain, trauma       COMPARISON: No prior comparison study       TECHNIQUE: Axial CT imaging of the cervical spine was  performed from the skull   base to the upper thoracic spine without intravenous contrast. Multiplanar   reformats were generated.  One or more dose reduction techniques were used on   this CT: automated exposure control, adjustment of the mAs and/or kVp according   to patient's size, and iterative reconstruction techniques. The specific   techniques utilized on this CT exam have been documented in the patient's   electronic medical record.       _______________       FINDINGS:       VERTEBRAE AND DISCS: Straightening of the normal lordosis is present which is   nonspecific but may be due to muscle spasm or positioning. No fracture or   subluxation is seen. Mild multilevel disc height loss with endplate osteophyte   formation is seen. Mild degenerative changes are seen along the anterior C1-C2   articulation.       SPINAL CANAL AND FORAMINA: Multilevel disc bulges are suggested narrowing the   spinal canal without high-grade stenosis. Multilevel mild central stenosis   suggested involving C3-C4 through the C6-7 levels, greatest at C3-4.    Hypertrophic changes do produce mild to moderate multilevel foraminal stenosis   greatest at C4-5 and C5-6 levels.       PREVERTEBRAL SOFT TISSUES: Unremarkable       VISIBLE INTRACRANIAL CONTENTS: Unremarkable.       LUNG APICES: Clear.       OTHER: Small hypodense foci are present along the superior aspect of the right   thyroid lobe, subcentimeter in size and quite nonspecific.       _______________               CXR Results  (Last 48 hours)    None          Medications given in the ED-  Medications   ketorolac (TORADOL) injection 15 mg (not administered)         Medical Decision Making   I am the first provider for this patient.    I reviewed the vital signs, available nursing notes, past medical history, past surgical history, family history and social history.    Vital Signs-Reviewed the patient's vital signs.    Pulse Oximetry Analysis - 100% on room air     Records Reviewed: Nursing Notes    Provider Notes (Medical Decision Making):   DDx: HA, migraine, sinusitis, meningitis, SAH, head injury/concussion, contusion, TIA, stroke, aneurism, change in visual acuity, vertigo, eye emergency, viral illness, temporal/Giant cell arteritis, scalp/skin etiology, malignancy  IMPRESSION AND MEDICAL DECISION MAKING:  Based upon the patient's presentation with noted HPI and PE, along with the work up done in the emergency department, I believe that the patient has a head contusion. CT reassuring. Will treat and have her follow up with her PCP.    PROGRESS NOTE: One or more blood pressure readings were noted elevated during the Pt's presentation in the emergency department this date. This abnormal reading has been cited in the Pt's diagnosis, and they have been encouraged to follow up with their primary care physician, or referred to a consultant for further evaluation and treatment.Pt advised of elevated BP. Advised Pt the need for follow up for the elevated BP. Advised Pt to  monitor and record BP readings. ACEP guidelines are  Initiating treatment for asymptomatic hypertension in the ED is not necessary when patients have follow-up; (2) Rapidly lowering blood pressure in asymptomatic patients in the ED is unnecessary and may be harmful  in some patients; (3) When ED treatment for asymptomatic hypertension is initiated, blood pressure management should attempt to gradually lower blood pressure and should not be expected to be normalized during the initial ED visit.     Procedures:  Procedures    ED Course:   10:03 AM Initial assessment performed. The patients presenting problems have been discussed, and they are in agreement with the care plan formulated and outlined with them.  I have encouraged them to ask questions as they arise throughout their visit.    Diagnosis and Disposition       DISCHARGE NOTE:    Tracey King's  results have been reviewed with her.  She has been counseled regarding her diagnosis, treatment, and plan.  She verbally conveys understanding and agreement of the signs, symptoms, diagnosis, treatment and prognosis and additionally agrees to follow up as discussed.  She also agrees with the care-plan and conveys that all of her questions have been answered.  I have also provided discharge instructions for her that include: educational information regarding their diagnosis and treatment, and list of reasons why they would want to return to the ED prior to their follow-up appointment, should her condition change. She has been provided with education for proper emergency department utilization.     CLINICAL IMPRESSION:    1. Contusion of scalp, initial encounter    2. Hematoma    3. Elevated blood pressure reading        PLAN:  1. D/C Home  2.   Current Discharge Medication List        3.   Follow-up Information     None        _______________________________    Attestations:  This note is prepared by Desiree Hane, acting as Scribe for Adrian Saran, PA-C.     Adrian Saran, PA-C:  The scribe's documentation has been prepared under my direction and personally reviewed by me in its entirety.  I confirm that the note above accurately reflects all work, treatment, procedures, and medical decision making performed by me.  _______________________________

## 2016-08-21 NOTE — ED Notes (Signed)
Assumed care of patient at this time.  Pt sitting in stretcher w/ visitors at the bedside in NAD.  Vitals stable.  Pt requesting pain medication, will make PA aware.

## 2016-08-21 NOTE — ED Notes (Signed)
Patient armband removed and shredded  I have reviewed discharge instructions with the patient.  The patient verbalized understanding.

## 2016-08-22 ENCOUNTER — Inpatient Hospital Stay
Admit: 2016-08-22 | Discharge: 2016-08-22 | Disposition: A | Discharge status: Home / Self Care | Payer: MEDICARE | Attending: Emergency Medicine

## 2016-08-22 DIAGNOSIS — S0003XA Contusion of scalp, initial encounter: Secondary | ICD-10-CM

## 2016-08-22 NOTE — ED Triage Notes (Signed)
Triage: pt fell yesterday, was seen in the ED post-fall. Pt reports bruising and swelling to left eye today, redness to sclera that developed today.    Sepsis Screening completed    (  )Patient meets SIRS criteria.  (x  )Patient does not meet SIRS criteria.      SIRS Criteria is achieved when two or more of the following are present  ? Temperature < 96.8??F (36??C) or > 100.9??F (38.3??C)  ? Heart Rate > 90 beats per minute  ? Respiratory Rate > 20 breaths per minute  ? WBC count > 12,000 or <4,000 or > 10% bands

## 2016-08-22 NOTE — ED Notes (Signed)
Discharged by Fidela Juneau PA

## 2016-08-22 NOTE — ED Provider Notes (Signed)
[place] [facility]  EMERGENCY DEPARTMENT HISTORY AND PHYSICAL EXAM    Date: 08/22/2016  Patient Name: Tracey King  Date of Birth: 1951-02-06  Medical Record Number: [id number]     History of Presenting Illness     Chief Complaint   Patient presents with   ??? Red Eye   ??? Fall       History Provided By: Patient    Chief Complaint: Bruising and swelling around left eye  Duration: Today  Timing:  Gradual  Location: Left eye  Quality: Bruising and swelling  Severity: Mild  Modifying Factors: No relieving or worsening factors   Associated Symptoms: An area of redness to left sclera    Additional History (Context):   6:25 PM   Tracey King is a 66 y.o. female with PMHx of COPD, HTN, CKD, GERD, sarcoidosis, and dialysis & PSHx bilateral eye surgery, who presents to the emergency department C/O bruising and swelling around left eye onset today. Associated symptoms include an area of redness to left sclera. Reports she had mechanical ground-level fall yesterday, pt had tripped, fallen forward, and hit her left forehead on the floor. She was seen by [facility] s/p fall yesterday and had CT head, which showed a hematoma, but no fxs. Pt is followed by [facility]. Pt denies any other Sx or complaints.      PCP: Delena Bali, MD    Current Outpatient Prescriptions   Medication Sig Dispense Refill   ??? isosorbide dinitrate (ISORDIL) 40 mg tablet Take 10 mg by mouth two (2) times a day.     ??? metoprolol succinate (TOPROL-XL) 25 mg XL tablet Take  by mouth daily.     ??? amLODIPine (NORVASC) 10 mg tablet Take  by mouth daily.     ??? famotidine (PEPCID) 20 mg tablet Take 20 mg by mouth two (2) times a day.     ??? allopurinol (ZYLOPRIM) 100 mg tablet Take  by mouth daily.     ??? atorvastatin (LIPITOR) 10 mg tablet Take 10 mg by mouth nightly.     ??? traMADol (ULTRAM) 50 mg tablet Take 50 mg by mouth every six (6) hours as needed for Pain.      ??? folic acid 1 mg tab 0.5 mg, multivitamin, stress formula tab 1 Tab Take  by mouth daily.     ??? albuterol (PROVENTIL HFA, VENTOLIN HFA, PROAIR HFA) 90 mcg/actuation inhaler Take 2 Puffs by inhalation every four (4) hours as needed for Wheezing.     ??? tiotropium bromide (SPIRIVA RESPIMAT) 2.5 mcg/actuation inhaler Take 2 Puffs by inhalation daily.     ??? aspirin 81 mg chewable tablet Take 81 mg by mouth daily.     ??? calcitRIOL (ROCALTROL) 0.25 mcg capsule Take 0.25 mcg by mouth daily.     ??? colchicine 0.6 mg tablet Take 0.6 mg by mouth daily.     ??? fluticasone-salmeterol (ADVAIR) 100-50 mcg/dose diskus inhaler Take 1 Puff by inhalation every twelve (12) hours.     ??? Polyethylene Glycol 3350 powd by Does Not Apply route.         Past History     Past Medical History:  Past Medical History:   Diagnosis Date   ??? Arrhythmia     intermittent "racing heart"   ??? Chronic kidney disease     ESRD, Stage IV   ??? Chronic obstructive pulmonary disease (HCC)    ??? Dialysis patient [facility])    ??? GERD (gastroesophageal  reflux disease)    ??? Hypertension    ??? Rheumatic fever 1970's   ??? Sarcoidosis        Past Surgical History:  Past Surgical History:   Procedure Laterality Date   ??? HX LAP CHOLECYSTECTOMY     ??? HX TAH AND BSO     ??? HX VASCULAR ACCESS         Family History:  Family History   Problem Relation Age of Onset   ??? Heart Attack Father        Social History:  Social History   Substance Use Topics   ??? Smoking status: Former Smoker     Quit date: 05/06/1968   ??? Smokeless tobacco: Never Used   ??? Alcohol use No       Allergies:  Allergies   Allergen Reactions   ??? Erythromycin Nausea and Vomiting   ??? Reglan [Metoclopramide Hcl] Anxiety         Review of Systems     Review of Systems   Constitutional: Negative for chills and fever.   HENT: Positive for facial swelling (around left eye). Negative for congestion and sore throat.    Eyes: Positive for redness.   Respiratory: Negative for cough and shortness of breath.     Cardiovascular: Negative for chest pain.   Gastrointestinal: Negative for abdominal distention, nausea and vomiting.   Genitourinary: Negative for difficulty urinating, dysuria, flank pain, frequency, hematuria, urgency, vaginal bleeding and vaginal discharge.   Musculoskeletal: Negative for arthralgias and joint swelling.   Skin: Positive for color change (bruising around left eye). Negative for rash and wound.   Neurological: Negative for dizziness, weakness, light-headedness and headaches.   Hematological: Negative for adenopathy.       Physical Exam     Vitals:    08/22/16 1755   BP: 187/78   Pulse: 78   Resp: 16   Temp: 98.4 ??F (36.9 ??C)   SpO2: 98%   Weight: 64 kg (141 lb)   Height:  (1.651 m)     Physical Exam   Constitutional: She is oriented to person, place, and time. She appears well-developed and well-nourished. No distress.   AA female in NAD. Alert. Appears comfortable.    HENT:   Head: Normocephalic. Head is with raccoon's eyes (left) and with contusion.   Right Ear: External ear normal. No swelling or tenderness. Tympanic membrane is not perforated, not erythematous and not bulging. No hemotympanum.   Left Ear: External ear normal. No swelling or tenderness. Tympanic membrane is not perforated, not erythematous and not bulging. No hemotympanum.   Nose: Nose normal.   Mouth/Throat: Uvula is midline, oropharynx is clear and moist and mucous membranes are normal.   Eyes: EOM are normal. Pupils are equal, round, and reactive to light. Right eye exhibits no discharge. Left eye exhibits no discharge. Left conjunctiva has a hemorrhage (@ 3 oclock position). No scleral icterus.   Neck: Normal range of motion. Neck supple.   Cardiovascular: Normal rate, regular rhythm, normal heart sounds and intact distal pulses.  Exam reveals no gallop and no friction rub.    No murmur heard.  Pulmonary/Chest: Effort normal and breath sounds normal. No accessory  muscle usage. No tachypnea. No respiratory distress. She has no decreased breath sounds. She has no wheezes. She has no rhonchi. She has no rales.   Abdominal: Soft.   Musculoskeletal: Normal range of motion. She exhibits no edema or tenderness.   Lymphadenopathy:  She has no cervical adenopathy.   Neurological: She is alert and oriented to person, place, and time.   Skin: Skin is warm and dry. No rash noted. She is not diaphoretic. No erythema.   Psychiatric: She has a normal mood and affect. Judgment normal.   Nursing note and vitals reviewed.        Diagnostic Study Results     Labs -   No results found for this or any previous visit (from the past 12 hour(s)).    Radiologic Studies -   CT Results  (Last 48 hours)               08/21/16 1107  CT HEAD WO CONT Final result    Impression:  IMPRESSION:       1. Left frontal scalp hematoma without evidence of underlying fracture.       2. No acute intracranial hemorrhage, mass effect, midline shift, or herniation.            3. Stable, very mild nonspecific white matter disease likely representing   chronic small vessel changes.        4. Stable suspected bilateral frontal deep hemispheric white matter chronic   infarcts. No definite CT evidence of acute cortical infarct is seen.  Please   note that noncontrast head CT may be normal in early acute infarct.       5. Stable very mild right ethmoidal sinus disease without air-fluid level.       Narrative:  EXAM: CT head       INDICATION: Fall, head injury, left forehead swelling       COMPARISON: CT head 08/08/2016       TECHNIQUE: Axial CT imaging of the head was performed without intravenous   contrast.  Additional coronal and sagittal reconstructions were performed. One   or more dose reduction techniques were used on this CT: automated exposure   control, adjustment of the mAs and/or kVp according to patient's size, and   iterative reconstruction techniques. The specific techniques utilized on this CT    exam have been documented in the patient's electronic medical record.   _______________       FINDINGS:       VENTRICLES/EXTRA-AXIAL SPACES: The ventricles and sulci are within normal limits   for patient's stated age in their size and configuration.       BRAIN PARENCHYMA: There is no evidence of acute intracranial hemorrhage, mass   effect, midline shift, or herniation. No definite CT evidence of acute cortical   infarct is seen. A very mild amount of hypodense white matter lesions are seen   within the periventricular and subcortical white matter which are nonspecific,   but likely represent chronic small vessel changes. More discrete hypodensities   are seen involving the anterior bifrontal white matter, unchanged which may   represent chronic infarcts.       OSSEOUS STRUCTURES: Localized left frontal scalp soft tissue swelling   hyperdensity is present consistent with scalp hematoma. No underlying calvarial   fracture is seen.       PARANASAL SINUSES/MASTOIDS: Soft tissue density is seen filling the right   ethmoidal L sella which also contains some internal calcific density perhaps   conclusions within inspissated mucus. No air-fluid levels are seen. Visualized   mastoid air cells are clear.       ORBITS: The visualized orbits are unremarkable.       OTHER: None.  _______________           08/21/16 1104  CT SPINE CERV WO CONT Final result    Impression:  IMPRESSION:       No cervical spine fracture or subluxation.       Multilevel degenerative changes scattered throughout the cervical spine , as   described in detail in Findings section. . Mild multilevel central stenosis at   C3-4 through C6-7 levels, greatest at C3-4.       No definite high-grade central or foraminal stenosis.       Please note this cervical spine CT was performed with patient supine.  This   supine study does not evaluate for ligamentous injury or exclude instability.     If the patient has persistent symptoms or if otherwise clinically indicated,   erect cervical spine plain films are recommended.           Narrative:  EXAM: CT Cervical spine       INDICATION: Neck pain, trauma       COMPARISON: No prior comparison study       TECHNIQUE: Axial CT imaging of the cervical spine was performed from the skull   base to the upper thoracic spine without intravenous contrast. Multiplanar   reformats were generated.  One or more dose reduction techniques were used on   this CT: automated exposure control, adjustment of the mAs and/or kVp according   to patient's size, and iterative reconstruction techniques. The specific   techniques utilized on this CT exam have been documented in the patient's   electronic medical record.       _______________       FINDINGS:       VERTEBRAE AND DISCS: Straightening of the normal lordosis is present which is   nonspecific but may be due to muscle spasm or positioning. No fracture or   subluxation is seen. Mild multilevel disc height loss with endplate osteophyte   formation is seen. Mild degenerative changes are seen along the anterior C1-C2   articulation.       SPINAL CANAL AND FORAMINA: Multilevel disc bulges are suggested narrowing the   spinal canal without high-grade stenosis. Multilevel mild central stenosis   suggested involving C3-C4 through the C6-7 levels, greatest at C3-4.   Hypertrophic changes do produce mild to moderate multilevel foraminal stenosis   greatest at C4-5 and C5-6 levels.       PREVERTEBRAL SOFT TISSUES: Unremarkable       VISIBLE INTRACRANIAL CONTENTS: Unremarkable.       LUNG APICES: Clear.       OTHER: Small hypodense foci are present along the superior aspect of the right   thyroid lobe, subcentimeter in size and quite nonspecific.       _______________               Medications Given in the ED:  Medications - No data to display     Medical Decision Making     I am the first provider for this patient.     I reviewed the vital signs, available nursing notes, past medical history, past surgical history, family history and social history.    Records Reviewed: Nursing Notes, Old Medical Records and Previous Radiology Studies    Vital Signs-Reviewed the patient's vital signs.  No data found.      Pulse Oximetry Analysis - Normal 98% on RA       Provider Notes (Medical Decision Making): subconjunctival hemorrhage, contusion, hematoma.  Procedures:  Procedures    ED Course:   6:25 PM Initial assessment performed. Pt and/or pt's family are aware of the plan of care and are in agreement.       Diagnosis and Disposition     Reviewed previous ED visit. CT FACIAL and CT HEAD unremarkable. No vision complaints. PERRL. EOMI. Small subconjunctival hemorrhage with facial hematoma. Gave reassurance. Reasons to RTED discussed with pt. All questions answered. Pt feels comfortable going home at this time. Pt expressed understanding and she agrees with plan.      Discharge Note:  6:30 PM  Sulay Brymer Kille's  results have been reviewed with her.  She has been counseled regarding her diagnosis, treatment, and plan.  She verbally conveys understanding and agreement of the signs, symptoms, diagnosis, treatment and prognosis and additionally agrees to follow up as discussed.  She also agrees with the care-plan and conveys that all of her questions have been answered.  I have also provided discharge instructions for her that include: educational information regarding their diagnosis and treatment, and list of reasons why they would want to return to the ED prior to their follow-up appointment, should her condition change. She has been provided with education for proper emergency department utilization.     Clinical Impression:    1. Facial hematoma, initial encounter    2. Subconjunctival hemorrhage, left        PLAN:  1. D/C Home  2.   Discharge Medication List as of 08/22/2016  6:31 PM        3.   Follow-up Information      Follow up With Details Comments Contact Info    Jeanene Erb, MD Schedule an appointment as soon as possible for a visit For follow up with [facility] [address] [place] [address]  [phone]      Delena Bali, MD Schedule an appointment as soon as possible for a visit in 2 days For primary care follow up [address] [place] [address]  [phone]      [facility] EMERGENCY DEPT Go to As needed, if symptoms worsen [address]  [place] [address]  [phone]        _______________________________    Attestations:  This note is prepared by Joeseph Amor, acting as Scribe for [employer], PA-C.    Cassandria Anger, PA-C:  The scribe's documentation has been prepared under my direction and personally reviewed by me in its entirety.  I confirm that the note above accurately reflects all work, treatment, procedures, and medical decision making performed by me.  _______________________________

## 2018-11-22 ENCOUNTER — Inpatient Hospital Stay (HOSPITAL_COMMUNITY): Payer: Medicare Other

## 2018-11-22 ENCOUNTER — Inpatient Hospital Stay (HOSPITAL_COMMUNITY)
Admission: EM | Admit: 2018-11-22 | Discharge: 2018-11-29 | DRG: 208 | Disposition: A | Discharge status: Home / Self Care | Payer: Medicare Other | Attending: Internal Medicine | Admitting: Internal Medicine

## 2018-11-22 ENCOUNTER — Emergency Department (HOSPITAL_COMMUNITY): Payer: Medicare Other

## 2018-11-22 DIAGNOSIS — R531 Weakness: Secondary | ICD-10-CM | POA: Diagnosis present

## 2018-11-22 DIAGNOSIS — E43 Unspecified severe protein-calorie malnutrition: Secondary | ICD-10-CM | POA: Diagnosis present

## 2018-11-22 DIAGNOSIS — J9601 Acute respiratory failure with hypoxia: Secondary | ICD-10-CM

## 2018-11-22 DIAGNOSIS — F05 Delirium due to known physiological condition: Secondary | ICD-10-CM | POA: Diagnosis not present

## 2018-11-22 DIAGNOSIS — E162 Hypoglycemia, unspecified: Secondary | ICD-10-CM | POA: Diagnosis not present

## 2018-11-22 DIAGNOSIS — T68XXXA Hypothermia, initial encounter: Secondary | ICD-10-CM

## 2018-11-22 DIAGNOSIS — Z992 Dependence on renal dialysis: Secondary | ICD-10-CM

## 2018-11-22 DIAGNOSIS — R451 Restlessness and agitation: Secondary | ICD-10-CM | POA: Diagnosis not present

## 2018-11-22 DIAGNOSIS — J209 Acute bronchitis, unspecified: Secondary | ICD-10-CM | POA: Diagnosis present

## 2018-11-22 DIAGNOSIS — Z7982 Long term (current) use of aspirin: Secondary | ICD-10-CM

## 2018-11-22 DIAGNOSIS — I959 Hypotension, unspecified: Secondary | ICD-10-CM | POA: Diagnosis not present

## 2018-11-22 DIAGNOSIS — J9602 Acute respiratory failure with hypercapnia: Secondary | ICD-10-CM

## 2018-11-22 DIAGNOSIS — N2581 Secondary hyperparathyroidism of renal origin: Secondary | ICD-10-CM | POA: Diagnosis present

## 2018-11-22 DIAGNOSIS — Z8673 Personal history of transient ischemic attack (TIA), and cerebral infarction without residual deficits: Secondary | ICD-10-CM

## 2018-11-22 DIAGNOSIS — Z20828 Contact with and (suspected) exposure to other viral communicable diseases: Secondary | ICD-10-CM | POA: Diagnosis present

## 2018-11-22 DIAGNOSIS — E8770 Fluid overload, unspecified: Secondary | ICD-10-CM | POA: Diagnosis present

## 2018-11-22 DIAGNOSIS — J440 Chronic obstructive pulmonary disease with acute lower respiratory infection: Secondary | ICD-10-CM | POA: Diagnosis present

## 2018-11-22 DIAGNOSIS — G9341 Metabolic encephalopathy: Secondary | ICD-10-CM | POA: Diagnosis present

## 2018-11-22 DIAGNOSIS — Z7952 Long term (current) use of systemic steroids: Secondary | ICD-10-CM

## 2018-11-22 DIAGNOSIS — E872 Acidosis: Secondary | ICD-10-CM | POA: Diagnosis present

## 2018-11-22 DIAGNOSIS — I161 Hypertensive emergency: Secondary | ICD-10-CM | POA: Diagnosis present

## 2018-11-22 DIAGNOSIS — J849 Interstitial pulmonary disease, unspecified: Secondary | ICD-10-CM | POA: Diagnosis present

## 2018-11-22 DIAGNOSIS — I361 Nonrheumatic tricuspid (valve) insufficiency: Secondary | ICD-10-CM

## 2018-11-22 DIAGNOSIS — J441 Chronic obstructive pulmonary disease with (acute) exacerbation: Secondary | ICD-10-CM | POA: Diagnosis present

## 2018-11-22 DIAGNOSIS — Z681 Body mass index (BMI) 19 or less, adult: Secondary | ICD-10-CM

## 2018-11-22 DIAGNOSIS — I120 Hypertensive chronic kidney disease with stage 5 chronic kidney disease or end stage renal disease: Secondary | ICD-10-CM | POA: Diagnosis present

## 2018-11-22 DIAGNOSIS — I10 Essential (primary) hypertension: Secondary | ICD-10-CM | POA: Diagnosis present

## 2018-11-22 DIAGNOSIS — E875 Hyperkalemia: Secondary | ICD-10-CM | POA: Diagnosis present

## 2018-11-22 DIAGNOSIS — D631 Anemia in chronic kidney disease: Secondary | ICD-10-CM | POA: Diagnosis present

## 2018-11-22 DIAGNOSIS — J9622 Acute and chronic respiratory failure with hypercapnia: Secondary | ICD-10-CM | POA: Diagnosis present

## 2018-11-22 DIAGNOSIS — Z885 Allergy status to narcotic agent status: Secondary | ICD-10-CM

## 2018-11-22 DIAGNOSIS — Z888 Allergy status to other drugs, medicaments and biological substances status: Secondary | ICD-10-CM

## 2018-11-22 DIAGNOSIS — N186 End stage renal disease: Secondary | ICD-10-CM | POA: Diagnosis present

## 2018-11-22 DIAGNOSIS — F0151 Vascular dementia with behavioral disturbance: Secondary | ICD-10-CM | POA: Diagnosis present

## 2018-11-22 DIAGNOSIS — R64 Cachexia: Secondary | ICD-10-CM | POA: Diagnosis present

## 2018-11-22 DIAGNOSIS — Z0189 Encounter for other specified special examinations: Secondary | ICD-10-CM

## 2018-11-22 DIAGNOSIS — R069 Unspecified abnormalities of breathing: Secondary | ICD-10-CM

## 2018-11-22 DIAGNOSIS — J9621 Acute and chronic respiratory failure with hypoxia: Principal | ICD-10-CM | POA: Diagnosis present

## 2018-11-22 DIAGNOSIS — Z79899 Other long term (current) drug therapy: Secondary | ICD-10-CM

## 2018-11-22 DIAGNOSIS — J9600 Acute respiratory failure, unspecified whether with hypoxia or hypercapnia: Secondary | ICD-10-CM | POA: Diagnosis present

## 2018-11-22 DIAGNOSIS — E8729 Other acidosis: Secondary | ICD-10-CM

## 2018-11-22 DIAGNOSIS — J810 Acute pulmonary edema: Secondary | ICD-10-CM | POA: Diagnosis present

## 2018-11-22 DIAGNOSIS — J9690 Respiratory failure, unspecified, unspecified whether with hypoxia or hypercapnia: Secondary | ICD-10-CM

## 2018-11-22 DIAGNOSIS — R739 Hyperglycemia, unspecified: Secondary | ICD-10-CM | POA: Diagnosis present

## 2018-11-22 DIAGNOSIS — F0150 Vascular dementia without behavioral disturbance: Secondary | ICD-10-CM | POA: Diagnosis present

## 2018-11-22 DIAGNOSIS — D869 Sarcoidosis, unspecified: Secondary | ICD-10-CM | POA: Diagnosis present

## 2018-11-22 DIAGNOSIS — Z87891 Personal history of nicotine dependence: Secondary | ICD-10-CM

## 2018-11-22 DIAGNOSIS — I340 Nonrheumatic mitral (valve) insufficiency: Secondary | ICD-10-CM

## 2018-11-22 DIAGNOSIS — J181 Lobar pneumonia, unspecified organism: Secondary | ICD-10-CM | POA: Diagnosis present

## 2018-11-22 LAB — CBC WITH DIFFERENTIAL/PLATELET
Abs Immature Granulocytes: 0.11 10*3/uL — ABNORMAL HIGH (ref 0.00–0.07)
Basophils Absolute: 0.1 10*3/uL (ref 0.0–0.1)
Basophils Relative: 0 %
Eosinophils Absolute: 0.1 10*3/uL (ref 0.0–0.5)
Eosinophils Relative: 0 %
HCT: 34.9 % — ABNORMAL LOW (ref 36.0–46.0)
Hemoglobin: 10.4 g/dL — ABNORMAL LOW (ref 12.0–15.0)
Immature Granulocytes: 1 %
Lymphocytes Relative: 7 %
Lymphs Abs: 1.1 10*3/uL (ref 0.7–4.0)
MCH: 28.7 pg (ref 26.0–34.0)
MCHC: 29.8 g/dL — ABNORMAL LOW (ref 30.0–36.0)
MCV: 96.4 fL (ref 80.0–100.0)
Monocytes Absolute: 0.2 10*3/uL (ref 0.1–1.0)
Monocytes Relative: 1 %
Neutro Abs: 14.5 10*3/uL — ABNORMAL HIGH (ref 1.7–7.7)
Neutrophils Relative %: 91 %
Platelets: 188 10*3/uL (ref 150–400)
RBC: 3.62 MIL/uL — ABNORMAL LOW (ref 3.87–5.11)
RDW: 16.4 % — ABNORMAL HIGH (ref 11.5–15.5)
WBC: 16 10*3/uL — ABNORMAL HIGH (ref 4.0–10.5)
nRBC: 0.3 % — ABNORMAL HIGH (ref 0.0–0.2)

## 2018-11-22 LAB — BASIC METABOLIC PANEL
Anion gap: 17 — ABNORMAL HIGH (ref 5–15)
BUN: 22 mg/dL (ref 8–23)
CO2: 23 mmol/L (ref 22–32)
Calcium: 10.8 mg/dL — ABNORMAL HIGH (ref 8.9–10.3)
Chloride: 95 mmol/L — ABNORMAL LOW (ref 98–111)
Creatinine, Ser: 4.5 mg/dL — ABNORMAL HIGH (ref 0.44–1.00)
GFR calc Af Amer: 11 mL/min — ABNORMAL LOW (ref >60)
GFR calc non Af Amer: 9 mL/min — ABNORMAL LOW (ref >60)
Glucose, Bld: 106 mg/dL — ABNORMAL HIGH (ref 70–99)
Potassium: 3.6 mmol/L (ref 3.5–5.1)
Sodium: 135 mmol/L (ref 135–145)

## 2018-11-22 LAB — COMPREHENSIVE METABOLIC PANEL
ALT: 45 U/L — ABNORMAL HIGH (ref 0–44)
AST: 72 U/L — ABNORMAL HIGH (ref 15–41)
Albumin: 3.6 g/dL (ref 3.5–5.0)
Alkaline Phosphatase: 121 U/L (ref 38–126)
Anion gap: 16 — ABNORMAL HIGH (ref 5–15)
BUN: 80 mg/dL — ABNORMAL HIGH (ref 8–23)
CO2: 24 mmol/L (ref 22–32)
Calcium: 8.9 mg/dL (ref 8.9–10.3)
Chloride: 101 mmol/L (ref 98–111)
Creatinine, Ser: 12.81 mg/dL — ABNORMAL HIGH (ref 0.44–1.00)
GFR calc Af Amer: 3 mL/min — ABNORMAL LOW (ref >60)
GFR calc non Af Amer: 3 mL/min — ABNORMAL LOW (ref >60)
Glucose, Bld: 244 mg/dL — ABNORMAL HIGH (ref 70–99)
Potassium: 7.4 mmol/L (ref 3.5–5.1)
Sodium: 141 mmol/L (ref 135–145)
Total Bilirubin: 0.7 mg/dL (ref 0.3–1.2)
Total Protein: 6.5 g/dL (ref 6.5–8.1)

## 2018-11-22 LAB — POCT I-STAT 7, (LYTES, BLD GAS, ICA,H+H)
Acid-base deficit: 1 mmol/L (ref 0.0–2.0)
Bicarbonate: 27.3 mmol/L (ref 20.0–28.0)
Calcium, Ion: 1.16 mmol/L (ref 1.15–1.40)
HCT: 32 % — ABNORMAL LOW (ref 36.0–46.0)
Hemoglobin: 10.9 g/dL — ABNORMAL LOW (ref 12.0–15.0)
O2 Saturation: 100 %
Patient temperature: 94.3
Potassium: 6.7 mmol/L (ref 3.5–5.1)
Sodium: 138 mmol/L (ref 135–145)
TCO2: 29 mmol/L (ref 22–32)
pCO2 arterial: 57.6 mmHg — ABNORMAL HIGH (ref 32.0–48.0)
pH, Arterial: 7.27 — ABNORMAL LOW (ref 7.350–7.450)
pO2, Arterial: 476 mmHg — ABNORMAL HIGH (ref 83.0–108.0)

## 2018-11-22 LAB — ECHOCARDIOGRAM COMPLETE
Height: 68 in
Weight: 1746.04 oz

## 2018-11-22 LAB — TROPONIN I (HIGH SENSITIVITY)
Troponin I (High Sensitivity): 55 ng/L — ABNORMAL HIGH (ref <18)
Troponin I (High Sensitivity): 135 ng/L (ref <18)

## 2018-11-22 LAB — SARS CORONAVIRUS 2 BY RT PCR (HOSPITAL ORDER, PERFORMED IN ~~LOC~~ HOSPITAL LAB): SARS Coronavirus 2: NEGATIVE

## 2018-11-22 LAB — ALBUMIN: Albumin: 3.6 g/dL (ref 3.5–5.0)

## 2018-11-22 LAB — GLUCOSE, CAPILLARY
Glucose-Capillary: 115 mg/dL — ABNORMAL HIGH (ref 70–99)
Glucose-Capillary: 104 mg/dL — ABNORMAL HIGH (ref 70–99)
Glucose-Capillary: 86 mg/dL (ref 70–99)
Glucose-Capillary: 89 mg/dL (ref 70–99)

## 2018-11-22 LAB — LACTIC ACID, PLASMA: Lactic Acid, Venous: 1.9 mmol/L (ref 0.5–1.9)

## 2018-11-22 LAB — PHOSPHORUS: Phosphorus: 2.5 mg/dL (ref 2.5–4.6)

## 2018-11-22 LAB — BRAIN NATRIURETIC PEPTIDE: B Natriuretic Peptide: 974.6 pg/mL — ABNORMAL HIGH (ref 0.0–100.0)

## 2018-11-22 LAB — MRSA PCR SCREENING: MRSA by PCR: NEGATIVE

## 2018-11-22 MED ORDER — CARVEDILOL 12.5 MG PO TABS
12.5000 mg | ORAL_TABLET | Freq: Two times a day (BID) | ORAL | Status: DC
  Administered 2018-11-22 – 2018-11-26 (×7): 12.5 mg
  Filled 2018-11-22 (×7): qty 1

## 2018-11-22 MED ORDER — PROPOFOL 1000 MG/100ML IV EMUL
5.0000 ug/kg/min | INTRAVENOUS | Status: DC
  Administered 2018-11-22: 12:00:00 20 ug/kg/min via INTRAVENOUS
  Administered 2018-11-22 – 2018-11-23 (×2): 50 ug/kg/min via INTRAVENOUS
  Filled 2018-11-22 (×3): qty 100

## 2018-11-22 MED ORDER — ASPIRIN 325 MG PO TABS
325.0000 mg | ORAL_TABLET | Freq: Every day | ORAL | Status: DC
  Administered 2018-11-22 – 2018-11-29 (×7): 325 mg via ORAL
  Filled 2018-11-22 (×8): qty 1

## 2018-11-22 MED ORDER — FAMOTIDINE 40 MG/5ML PO SUSR
20.0000 mg | Freq: Every day | ORAL | Status: DC
  Administered 2018-11-24 – 2018-11-26 (×3): 20 mg
  Filled 2018-11-22 (×4): qty 2.5

## 2018-11-22 MED ORDER — CHLORHEXIDINE GLUCONATE CLOTH 2 % EX PADS
6.0000 | MEDICATED_PAD | Freq: Every day | CUTANEOUS | Status: DC
  Administered 2018-11-22 – 2018-11-29 (×7): 6 via TOPICAL

## 2018-11-22 MED ORDER — CLEVIDIPINE BUTYRATE 0.5 MG/ML IV EMUL
0.0000 mg/h | INTRAVENOUS | Status: DC
  Administered 2018-11-22: 23:00:00 10 mg/h via INTRAVENOUS
  Administered 2018-11-22: 1 mg/h via INTRAVENOUS
  Administered 2018-11-22: 10 mg/h via INTRAVENOUS
  Administered 2018-11-23: 12 mg/h via INTRAVENOUS
  Administered 2018-11-23: 14 mg/h via INTRAVENOUS
  Administered 2018-11-23: 10 mg/h via INTRAVENOUS
  Administered 2018-11-23: 16 mg/h via INTRAVENOUS
  Filled 2018-11-22: qty 50
  Filled 2018-11-22 (×3): qty 100
  Filled 2018-11-22: qty 50
  Filled 2018-11-22: qty 100
  Filled 2018-11-22: qty 50
  Filled 2018-11-22: qty 100

## 2018-11-22 MED ORDER — LIDOCAINE HCL (PF) 1 % IJ SOLN: 5.0000 mL | INTRAMUSCULAR | Status: DC | PRN

## 2018-11-22 MED ORDER — PREDNISONE 10 MG PO TABS
10.0000 mg | ORAL_TABLET | Freq: Every day | ORAL | Status: DC
  Administered 2018-11-22 – 2018-11-26 (×4): 10 mg
  Filled 2018-11-22 (×4): qty 1
  Filled 2018-11-22 (×2): qty 0.5

## 2018-11-22 MED ORDER — DEXTROSE 50 % IV SOLN
1.0000 | Freq: Once | INTRAVENOUS | Status: AC
  Administered 2018-11-22: 50 mL via INTRAVENOUS
  Administered 2018-11-23: 01:00:00 25 mL via INTRAVENOUS
  Filled 2018-11-22: qty 50

## 2018-11-22 MED ORDER — PENTAFLUOROPROP-TETRAFLUOROETH EX AERO: 1.0000 "application " | INHALATION_SPRAY | CUTANEOUS | Status: DC | PRN

## 2018-11-22 MED ORDER — SODIUM CHLORIDE 0.9 % IV SOLN: 100.0000 mL | INTRAVENOUS | Status: DC | PRN

## 2018-11-22 MED ORDER — INSULIN ASPART 100 UNIT/ML ~~LOC~~ SOLN
1.0000 [IU] | SUBCUTANEOUS | Status: DC
  Administered 2018-11-24: 1 [IU] via SUBCUTANEOUS
  Administered 2018-11-25: 2 [IU] via SUBCUTANEOUS
  Administered 2018-11-25: 1 [IU] via SUBCUTANEOUS
  Administered 2018-11-25: 2 [IU] via SUBCUTANEOUS
  Administered 2018-11-25 (×2): 1 [IU] via SUBCUTANEOUS
  Administered 2018-11-25: 2 [IU] via SUBCUTANEOUS
  Administered 2018-11-25: 1 [IU] via SUBCUTANEOUS
  Administered 2018-11-26 (×2): 2 [IU] via SUBCUTANEOUS
  Administered 2018-11-26: 3 [IU] via SUBCUTANEOUS
  Administered 2018-11-26: 2 [IU] via SUBCUTANEOUS

## 2018-11-22 MED ORDER — HEPARIN SODIUM (PORCINE) 5000 UNIT/ML IJ SOLN
5000.0000 [IU] | Freq: Three times a day (TID) | INTRAMUSCULAR | Status: DC
  Administered 2018-11-22 – 2018-11-28 (×18): 5000 [IU] via SUBCUTANEOUS
  Filled 2018-11-22 (×19): qty 1

## 2018-11-22 MED ORDER — HEPARIN SODIUM (PORCINE) 1000 UNIT/ML DIALYSIS
1000.0000 [IU] | INTRAMUSCULAR | Status: DC | PRN
  Filled 2018-11-22: qty 1

## 2018-11-22 MED ORDER — ORAL CARE MOUTH RINSE
15.0000 mL | OROMUCOSAL | Status: DC
  Administered 2018-11-22 – 2018-11-23 (×6): 15 mL via OROMUCOSAL

## 2018-11-22 MED ORDER — HYDRALAZINE HCL 20 MG/ML IJ SOLN
20.0000 mg | INTRAMUSCULAR | Status: DC | PRN
  Administered 2018-11-22 – 2018-11-25 (×6): 20 mg via INTRAVENOUS
  Filled 2018-11-22 (×6): qty 1

## 2018-11-22 MED ORDER — CHLORHEXIDINE GLUCONATE 0.12% ORAL RINSE (MEDLINE KIT)
15.0000 mL | Freq: Two times a day (BID) | OROMUCOSAL | Status: DC
  Administered 2018-11-23: 09:00:00 15 mL via OROMUCOSAL

## 2018-11-22 MED ORDER — INSULIN ASPART 100 UNIT/ML IV SOLN
5.0000 [IU] | Freq: Once | INTRAVENOUS | Status: AC
  Administered 2018-11-22: 5 [IU] via INTRAVENOUS

## 2018-11-22 MED ORDER — ONDANSETRON HCL 4 MG/2ML IJ SOLN: 4.0000 mg | Freq: Four times a day (QID) | INTRAMUSCULAR | Status: DC | PRN

## 2018-11-22 MED ORDER — PROPOFOL 1000 MG/100ML IV EMUL
5.0000 ug/kg/min | INTRAVENOUS | Status: DC
  Administered 2018-11-22: 10 ug/kg/min via INTRAVENOUS

## 2018-11-22 MED ORDER — ALBUTEROL SULFATE (2.5 MG/3ML) 0.083% IN NEBU: 10.0000 mg | INHALATION_SOLUTION | Freq: Once | RESPIRATORY_TRACT | Status: DC

## 2018-11-22 MED ORDER — METHYLPREDNISOLONE SODIUM SUCC 125 MG IJ SOLR
125.0000 mg | Freq: Once | INTRAMUSCULAR | Status: AC
  Administered 2018-11-22: 125 mg via INTRAVENOUS
  Filled 2018-11-22: qty 2

## 2018-11-22 MED ORDER — LIDOCAINE-PRILOCAINE 2.5-2.5 % EX CREA
1.0000 "application " | TOPICAL_CREAM | CUTANEOUS | Status: DC | PRN
  Filled 2018-11-22: qty 5

## 2018-11-22 MED ORDER — PROPOFOL 1000 MG/100ML IV EMUL
INTRAVENOUS | Status: AC
  Administered 2018-11-23: 50 ug/kg/min via INTRAVENOUS
  Filled 2018-11-22: qty 100

## 2018-11-22 MED ORDER — ETOMIDATE 2 MG/ML IV SOLN: 20.0000 mg | Freq: Once | INTRAVENOUS | Status: DC

## 2018-11-22 MED ORDER — ROCURONIUM BROMIDE 50 MG/5ML IV SOLN
60.0000 mg | Freq: Once | INTRAVENOUS | Status: DC
  Filled 2018-11-22: qty 6

## 2018-11-22 MED ORDER — ETOMIDATE 2 MG/ML IV SOLN
INTRAVENOUS | Status: AC | PRN
  Administered 2018-11-22: 20 mg via INTRAVENOUS

## 2018-11-22 MED ORDER — SODIUM CHLORIDE 0.9 % IV SOLN
2.0000 g | Freq: Once | INTRAVENOUS | Status: AC
  Administered 2018-11-22: 2 g via INTRAVENOUS
  Filled 2018-11-22: qty 2

## 2018-11-22 MED ORDER — FAMOTIDINE 40 MG/5ML PO SUSR
20.0000 mg | Freq: Two times a day (BID) | ORAL | Status: DC
  Administered 2018-11-22: 20 mg
  Filled 2018-11-22: qty 2.5

## 2018-11-22 MED ORDER — VANCOMYCIN HCL IN DEXTROSE 1-5 GM/200ML-% IV SOLN
1000.0000 mg | Freq: Once | INTRAVENOUS | Status: AC
  Administered 2018-11-22: 1000 mg via INTRAVENOUS
  Filled 2018-11-22: qty 200

## 2018-11-22 MED ORDER — SODIUM CHLORIDE 0.9 % IV SOLN
1.0000 g | Freq: Once | INTRAVENOUS | Status: AC
  Administered 2018-11-22: 1 g via INTRAVENOUS
  Filled 2018-11-22: qty 10

## 2018-11-22 MED ORDER — HYDRALAZINE HCL 20 MG/ML IJ SOLN: 10.0000 mg | Freq: Once | INTRAMUSCULAR | Status: DC

## 2018-11-22 MED ORDER — ROCURONIUM BROMIDE 50 MG/5ML IV SOLN
INTRAVENOUS | Status: AC | PRN
  Administered 2018-11-22: 60 mg via INTRAVENOUS

## 2018-11-22 MED ORDER — SODIUM CHLORIDE 0.9 % IV SOLN
1.0000 g | INTRAVENOUS | Status: DC
  Administered 2018-11-22 – 2018-11-24 (×3): 1 g via INTRAVENOUS
  Filled 2018-11-22 (×4): qty 1

## 2018-11-22 MED ORDER — ACETAMINOPHEN 325 MG PO TABS
650.0000 mg | ORAL_TABLET | ORAL | Status: DC | PRN
  Filled 2018-11-22: qty 2

## 2018-11-22 NOTE — Progress Notes (Signed)
Assisted tele visit to patient with family member.  Meredeth Furber Ann, RN  

## 2018-11-22 NOTE — ED Notes (Signed)
ED TO INPATIENT HANDOFF REPORT  ED Nurse Name and Phone #: Burman Nieves [phone]  S Name/Age/Gender Molly Roberts 68 y.o. female Room/Bed: TRACC/TRACC  Code Status   Code Status: Full Code  Home/SNF/Other Home  Is this baseline? No   Triage Complete: Triage complete  Chief Complaint CPAP   Triage Note Per EMS, visting from out of town.  Missed dialysis to celebrate birthday, Not sure of dialysis schedule or last appointment kept.  Family found pt unresponsive struggling to breath, initial on 58% on RA.  EMS gave 1 Epi, 2 g Mag.    Allergies Not on File  Level of Care/Admitting Diagnosis ED Disposition    ED Disposition Condition [facility]: [place] [[id number]]  Level of Care: ICU [6]  Covid Evaluation: Confirmed COVID Negative  Diagnosis: Acute respiratory failure ([place]) [518.81.ICD-9-CM]  Admitting Physician: Rigoberto Noel [[id number]]  Attending Physician: Rigoberto Noel [[id number]]  Estimated length of stay: past midnight tomorrow  Certification:: I certify this patient will need inpatient services for at least 2 midnights  PT Class (Do Not Modify): Inpatient [101]  PT Acc Code (Do Not Modify): Private [1]       B Medical/Surgery History No past medical history on file.    A IV Location/Drains/Wounds Patient Lines/Drains/Airways Status   Active Line/Drains/Airways    Name:   Placement date:   Placement time:   Site:   Days:   Peripheral IV 11/22/18 Right Wrist   11/22/18    0524    Wrist   less than 1   Peripheral IV 11/22/18 External jugular   11/22/18    0620    External jugular   less than 1   NG/OG Tube Orogastric 18 Fr. Right mouth Xray;Aucultation Documented cm marking at nare/ corner of mouth   11/22/18    0539    Right mouth   less than 1   Airway 7.5 mm   11/22/18    0515     less than 1          Intake/Output Last 24 hours No intake or output data in the 24 hours ending 11/22/18 0757  Labs/Imaging Results for  orders placed or performed during the hospital encounter of 11/22/18 (from the past 48 hour(s))  SARS Coronavirus 2 (CEPHEID - Performed in [place] hospital lab), Hosp Order     Status: None   Collection Time: 11/22/18  5:38 AM   Specimen: Nasopharyngeal Swab  Result Value Ref Range   SARS Coronavirus 2 NEGATIVE NEGATIVE    Comment: (NOTE) If result is NEGATIVE SARS-CoV-2 target nucleic acids are NOT DETECTED. The SARS-CoV-2 RNA is generally detectable in upper and lower  respiratory specimens during the acute phase of infection. The lowest  concentration of SARS-CoV-2 viral copies this assay can detect is 250  copies / mL. A negative result does not preclude SARS-CoV-2 infection  and should not be used as the sole basis for treatment or other  patient management decisions.  A negative result may occur with  improper specimen collection / handling, submission of specimen other  than nasopharyngeal swab, presence of viral mutation(s) within the  areas targeted by this assay, and inadequate number of viral copies  (<250 copies / mL). A negative result must be combined with clinical  observations, patient history, and epidemiological information. If result is POSITIVE SARS-CoV-2 target nucleic acids are DETECTED. The SARS-CoV-2 RNA is generally detectable in upper and lower  respiratory  specimens dur ing the acute phase of infection.  Positive  results are indicative of active infection with SARS-CoV-2.  Clinical  correlation with patient history and other diagnostic information is  necessary to determine patient infection status.  Positive results do  not rule out bacterial infection or co-infection with other viruses. If result is PRESUMPTIVE POSTIVE SARS-CoV-2 nucleic acids MAY BE PRESENT.   A presumptive positive result was obtained on the submitted specimen  and confirmed on repeat testing.  While 2019 novel coronavirus  (SARS-CoV-2) nucleic acids may be present in the  submitted sample  additional confirmatory testing may be necessary for epidemiological  and / or clinical management purposes  to differentiate between  SARS-CoV-2 and other Sarbecovirus currently known to infect humans.  If clinically indicated additional testing with an alternate test  methodology [phone]) is advised. The SARS-CoV-2 RNA is generally  detectable in upper and lower respiratory sp ecimens during the acute  phase of infection. The expected result is Negative. Fact Sheet for Patients:  StrictlyIdeas.no Fact Sheet for Healthcare Providers: BankingDealers.co.za This test is not yet approved or cleared by the [place] FDA and has been authorized for detection and/or diagnosis of SARS-CoV-2 by FDA under an Emergency Use Authorization (EUA).  This EUA will remain in effect (meaning this test can be used) for the duration of the COVID-19 declaration under Section 564(b)(1) of the Act, 21 U.S.C. section 360bbb-3(b)(1), unless the authorization is terminated or revoked sooner. Performed at [facility], [place] [address], [place], [place] [address]   CBC with Differential     Status: Abnormal   Collection Time: 11/22/18  5:47 AM  Result Value Ref Range   WBC 16.0 (H) 4.0 - 10.5 K/uL   RBC 3.62 (L) 3.87 - 5.11 MIL/uL   Hemoglobin 10.4 (L) 12.0 - 15.0 g/dL   HCT 34.9 (L) 36.0 - 46.0 %   MCV 96.4 80.0 - 100.0 fL   MCH 28.7 26.0 - 34.0 pg   MCHC 29.8 (L) 30.0 - 36.0 g/dL   RDW 16.4 (H) 11.5 - 15.5 %   Platelets 188 150 - 400 K/uL   nRBC 0.3 (H) 0.0 - 0.2 %   Neutrophils Relative % 91 %   Neutro Abs 14.5 (H) 1.7 - 7.7 K/uL   Lymphocytes Relative 7 %   Lymphs Abs 1.1 0.7 - 4.0 K/uL   Monocytes Relative 1 %   Monocytes Absolute 0.2 0.1 - 1.0 K/uL   Eosinophils Relative 0 %   Eosinophils Absolute 0.1 0.0 - 0.5 K/uL   Basophils Relative 0 %   Basophils Absolute 0.1 0.0 - 0.1 K/uL   Immature Granulocytes 1 %   Abs  Immature Granulocytes 0.11 (H) 0.00 - 0.07 K/uL    Comment: Performed at [facility] [address] [place], [place] [address]  Comprehensive metabolic panel     Status: Abnormal   Collection Time: 11/22/18  5:47 AM  Result Value Ref Range   Sodium 141 135 - 145 mmol/L   Potassium 7.4 (HH) 3.5 - 5.1 mmol/L    Comment: CRITICAL RESULT CALLED TO, READ BACK BY AND VERIFIED WITH: GLOSTER J,RN 11/22/18 0642 WAYK    Chloride 101 98 - 111 mmol/L   CO2 24 22 - 32 mmol/L   Glucose, Bld 244 (H) 70 - 99 mg/dL   BUN 80 (H) 8 - 23 mg/dL   Creatinine, Ser 12.81 (H) 0.44 - 1.00 mg/dL   Calcium 8.9 8.9 - 10.3 mg/dL  Total Protein 6.5 6.5 - 8.1 g/dL   Albumin 3.6 3.5 - 5.0 g/dL   AST 72 (H) 15 - 41 U/L   ALT 45 (H) 0 - 44 U/L   Alkaline Phosphatase 121 38 - 126 U/L   Total Bilirubin 0.7 0.3 - 1.2 mg/dL   GFR calc non Af Amer 3 (L) >60 mL/min   GFR calc Af Amer 3 (L) >60 mL/min   Anion gap 16 (H) 5 - 15    Comment: Performed at [facility] [address], [place], [place] [address]  Troponin I (High Sensitivity)     Status: Abnormal   Collection Time: 11/22/18  5:47 AM  Result Value Ref Range   Troponin I (High Sensitivity) 55 (H) <18 ng/L    Comment: Performed at [facility] [address], [place], [place] [address]  I-STAT 7, (LYTES, BLD GAS, ICA, H+H)     Status: Abnormal   Collection Time: 11/22/18  5:52 AM  Result Value Ref Range   pH, Arterial 7.270 (L) 7.350 - 7.450   pCO2 arterial 57.6 (H) 32.0 - 48.0 mmHg   pO2, Arterial 476.0 (H) 83.0 - 108.0 mmHg   Bicarbonate 27.3 20.0 - 28.0 mmol/L   TCO2 29 22 - 32 mmol/L   O2 Saturation 100.0 %   Acid-base deficit 1.0 0.0 - 2.0 mmol/L   Sodium 138 135 - 145 mmol/L   Potassium 6.7 (HH) 3.5 - 5.1 mmol/L   Calcium, Ion 1.16 1.15 - 1.40 mmol/L   HCT 32.0 (L) 36.0 - 46.0 %   Hemoglobin 10.9 (L) 12.0 - 15.0 g/dL   Patient temperature 94.3 F    Collection site RADIAL, ALLEN'S TEST ACCEPTABLE    Drawn by RT    Sample  type ARTERIAL    Comment NOTIFIED PHYSICIAN   Brain natriuretic peptide     Status: Abnormal   Collection Time: 11/22/18  6:00 AM  Result Value Ref Range   B Natriuretic Peptide 974.6 (H) 0.0 - 100.0 pg/mL    Comment: Performed at [facility] [address] [place], [place] [address]  Lactic acid, plasma     Status: None   Collection Time: 11/22/18  6:04 AM  Result Value Ref Range   Lactic Acid, Venous 1.9 0.5 - 1.9 mmol/L    Comment: Performed at [facility] [address] [place], [place] [address]   Dg Chest Portable 1 View  Result Date: 11/22/2018 CLINICAL DATA:  Intubated for shortness of breath. EXAM: PORTABLE CHEST 1 VIEW COMPARISON:  None. FINDINGS: Endotracheal tube is well positioned with tip approximately 5 cm above the carina. Enteric tube passes below the diaphragm. Lungs are hyperexpanded. Coarse lung markings bilaterally suggesting some degree of chronic interstitial lung disease/fibrosis. Superimposed edema versus pneumonia in the LEFT perihilar lung. Small bilateral pleural effusions, versus more likely chronic bilateral pleural thickening. No pneumothorax seen. No acute appearing osseous abnormality. IMPRESSION: 1. Endotracheal tube well positioned with tip approximately 5 cm above the carina. 2. Enteric tube passes below the diaphragm. 3. Hyperexpanded lungs indicating COPD. Coarse lung markings bilaterally suggesting some degree of chronic interstitial lung disease/fibrosis. 4. Superimposed edema versus pneumonia in the LEFT perihilar lung. Electronically Signed   By: Franki Cabot M.D.   On: 11/22/2018 06:04    Pending Labs Unresulted Labs (From admission, onward)    Start     Ordered   11/23/18 0500  CBC  Tomorrow morning,   R  11/22/18 0751   11/22/18 0751  HIV antibody (Routine Testing)  Add-on,   AD     11/22/18 0751   11/22/18 0553  Blood culture (routine x 2)  BLOOD CULTURE X 2,   STAT     11/22/18 0553          Vitals/Pain Today's  Vitals   11/22/18 0650 11/22/18 0654 11/22/18 0727 11/22/18 0731  BP: (!) 138/98 (!) 143/100  (!) 168/106  Pulse: 81 84  85  Resp: (!) 22 (!) 22  (!) 22  Temp:   (!) 94.5 F (34.7 C)   TempSrc:   Rectal   SpO2: 97% 99%  100%  Height:        Isolation Precautions No active isolations  Medications Medications  etomidate (AMIDATE) injection 20 mg (20 mg Intravenous Not Given 11/22/18 0736)  rocuronium (ZEMURON) injection 60 mg (60 mg Intravenous Not Given 11/22/18 0736)  propofol (DIPRIVAN) 1000 MG/100ML infusion (15 mcg/kg/min  Rate/Dose Change 11/22/18 0633)  propofol (DIPRIVAN) 1000 MG/100ML infusion (10 mcg/kg/min Intravenous New Bag/Given 11/22/18 0528)  vancomycin (VANCOCIN) IVPB 1000 mg/200 mL premix (1,000 mg Intravenous New Bag/Given 11/22/18 0735)  albuterol (PROVENTIL) (2.5 MG/3ML) 0.083% nebulizer solution 10 mg (has no administration in time range)  calcium gluconate 1 g in sodium chloride 0.9 % 100 mL IVPB (has no administration in time range)  Chlorhexidine Gluconate Cloth 2 % PADS 6 each (has no administration in time range)  heparin injection 5,000 Units (has no administration in time range)  acetaminophen (TYLENOL) tablet 650 mg (has no administration in time range)  ondansetron (ZOFRAN) injection 4 mg (has no administration in time range)  famotidine (PEPCID) 40 MG/5ML suspension 20 mg (has no administration in time range)  rocuronium (ZEMURON) injection (60 mg Intravenous Given 11/22/18 0513)  etomidate (AMIDATE) injection (20 mg Intravenous Given 11/22/18 0514)  ceFEPIme (MAXIPIME) 2 g in sodium chloride 0.9 % 100 mL IVPB (0 g Intravenous Stopped 11/22/18 0713)  methylPREDNISolone sodium succinate (SOLU-MEDROL) 125 mg/2 mL injection 125 mg (125 mg Intravenous Given 11/22/18 0619)  insulin aspart (novoLOG) injection 5 Units (5 Units Intravenous Given 11/22/18 0728)    And  dextrose 50 % solution 50 mL (50 mLs Intravenous Given 11/22/18 0728)    Mobility walks High fall  risk   Focused Assessments Renal Assessment Handoff:  Hemodialysis Schedule:  Last Hemodialysis date and time:    Restricted appendage: left arm     R Recommendations: See Admitting Provider Note  Report given to:   Additional Notes:

## 2018-11-22 NOTE — Consult Note (Addendum)
[name] Renal Consultation Note    Indication for Consultation:  Management of ESRD/hemodialysis; anemia, hypertension/volume and secondary hyperparathyroidism PCP:  HPI: Molly Roberts is a 68 y.o. female with ESRD on hemodialysis T,Th,S at [facility] center) in [place], [place]. Patient missed HD to go on vacation for her birthday. Last HD July 15th, 2020 per daugther, Molly Roberts. Apparently patient went on planned vacation without scheduling for transient treatment while traveling. (Treatment of transient patients has been suspended due to COVID 19) Of note, patient had similar incident last year doing the same. PMH of hypertension, "mini strokes" sarcoidosis/COPD. Daughter says she is a former smoker who quit smoking in her 68s but couldn't add more to [name].   Patient presented to ED this AM via EMS in acute respiratory distress, metabolic acidosis, hypothermia, hypertensive and hyperkalemic. Chest Xray on arrival with hyper expansion of lungs suggestive of COPD, coarse markings suggestive of interstitial lung disease/fibrosis. Superimposed pulmonary edema vs pneumonia in L perihilar lung. K+7.4. SCr 12.81 BUN 80 CO2 24 BS 244.  Troponin 50s. EKG-ST with LVH. COVID 19 negative She improved transiently on 100% NRB/CPAP but was still lethargic, not following commands. She  ultimately required oral intubation and mechanical ventilation. She is currently being seen in MICU, intubated, sedated. Urgent hemodialysis for hyperkalemia.   Social History:  has no history on file for tobacco, alcohol, and drug. Allergies  Allergen Reactions  . Ativan [Lorazepam] Other (See Comments)    confusion  . Codeine Other (See Comments)    confusion  . Morphine And Related Other (See Comments)    agitation   Prior to Admission medications   Medication Sig Start Date End Date Taking? Authorizing Provider  acetaminophen (TYLENOL) 500 MG tablet Take 500-1,000 mg by mouth every  6 (six) hours as needed for mild pain or fever.   Yes [provider]  albuterol (VENTOLIN HFA) 108 (90 Base) MCG/ACT inhaler Inhale 2 puffs into the lungs every 6 (six) hours as needed for wheezing or shortness of breath.   Yes [provider]  anti-nausea (EMETROL) solution Take 10 mLs by mouth every 15 (fifteen) minutes as needed for nausea or vomiting.   Yes [provider]  aspirin EC 81 MG tablet Take 81 mg by mouth daily.   Yes [provider]  bismuth subsalicylate (PEPTO BISMOL) 262 MG/15ML suspension Take 30 mLs by mouth every 6 (six) hours as needed for indigestion.   Yes [provider]  calcium carbonate (TUMS EX) 750 MG chewable tablet Chew 1-2 tablets by mouth daily as needed for heartburn.   Yes [provider]  carvedilol (COREG) 12.5 MG tablet Take 12.5 mg by mouth 2 (two) times daily with a meal.   Yes [provider]  clonazePAM (KLONOPIN) 0.5 MG tablet Take 0.5 mg by mouth 2 (two) times daily as needed for anxiety.   Yes [provider]  dicyclomine (BENTYL) 20 MG tablet Take 20 mg by mouth every 8 (eight) hours as needed for spasms.   Yes [provider]  donepezil (ARICEPT) 5 MG tablet Take 10 mg by mouth at bedtime.    Yes [provider]  famotidine (PEPCID) 20 MG tablet Take 20 mg by mouth daily.   Yes [provider]  ferrous sulfate 325 (65 FE) MG tablet Take 325 mg by mouth daily with breakfast.   Yes [provider]  labetalol (NORMODYNE) 200 MG tablet Take 100-200 mg by mouth 2 (two) times daily.  Yes [provider]  losartan (COZAAR) 50 MG tablet Take 50 mg by mouth daily.   Yes [provider]  memantine (NAMENDA XR) 28 MG CP24 24 hr capsule Take 28 mg by mouth daily.   Yes [provider]  montelukast (SINGULAIR) 10 MG tablet Take 10 mg by mouth at bedtime.   Yes [provider]  ondansetron (ZOFRAN) 4 MG tablet Take 4 mg  by mouth every 8 (eight) hours as needed for nausea or vomiting.   Yes [provider]  pantoprazole (PROTONIX) 40 MG tablet Take 40 mg by mouth daily.   Yes [provider]  predniSONE (DELTASONE) 10 MG tablet Take 10 mg by mouth daily with breakfast.    Yes [provider]  Valerian 500 MG CAPS Take 1-2 capsules by mouth at bedtime as needed (sleep).   Yes [provider]   Current Facility-Administered Medications  Medication Dose Route Frequency Provider Last Rate Last Dose  . acetaminophen (TYLENOL) tablet 650 mg  650 mg Oral Q4H PRN Rigoberto Noel, MD      . albuterol (PROVENTIL) (2.5 MG/3ML) 0.083% nebulizer solution 10 mg  10 mg Nebulization Once Ward, Kristen N, DO      . aspirin tablet 325 mg  325 mg Oral Daily Kara Mead V, MD      . calcium gluconate 1 g in sodium chloride 0.9 % 100 mL IVPB  1 g Intravenous Once Maudie Flakes, MD      . carvedilol (COREG) tablet 12.5 mg  12.5 mg Per Tube BID WC Parrett, Tammy S, NP      . Chlorhexidine Gluconate Cloth 2 % PADS 6 each  6 each Topical Q0600 Finnigan, Nancy A, DO      . etomidate (AMIDATE) injection 20 mg  20 mg Intravenous Once Ward, Kristen N, DO      . famotidine (PEPCID) 40 MG/5ML suspension 20 mg  20 mg Per Tube BID Kara Mead V, MD      . heparin injection 5,000 Units  5,000 Units Subcutaneous Q8H Kara Mead V, MD      . insulin aspart (novoLOG) injection 1-3 Units  1-3 Units Subcutaneous Q4H Parrett, Tammy S, NP      . ondansetron (ZOFRAN) injection 4 mg  4 mg Intravenous Q6H PRN Rigoberto Noel, MD      . predniSONE (DELTASONE) tablet 10 mg  10 mg Per Tube Q breakfast Parrett, Tammy S, NP      . propofol (DIPRIVAN) 1000 MG/100ML infusion        15 mcg/kg/min at 11/22/18 0633  . propofol (DIPRIVAN) 1000 MG/100ML infusion  5-80 mcg/kg/min Intravenous Continuous Ward, Kristen N, DO   10 mcg/kg/min at 11/22/18 0528  . rocuronium (ZEMURON) injection 60 mg  60 mg Intravenous Once Ward,  Delice Bison, DO       Labs: Basic Metabolic Panel: Recent Labs  Lab 11/22/18 0547 11/22/18 0552  NA 141 138  K 7.4* 6.7*  CL 101  --   CO2 24  --   GLUCOSE 244*  --   BUN 80*  --   CREATININE 12.81*  --   CALCIUM 8.9  --    Liver Function Tests: Recent Labs  Lab 11/22/18 0547  AST 72*  ALT 45*  ALKPHOS 121  BILITOT 0.7  PROT 6.5  ALBUMIN 3.6   No results for input(s): LIPASE, AMYLASE in the last 168 hours. No results for input(s): AMMONIA in the last 168  hours. CBC: Recent Labs  Lab 11/22/18 0547 11/22/18 0552  WBC 16.0*  --   NEUTROABS 14.5*  --   HGB 10.4* 10.9*  HCT 34.9* 32.0*  MCV 96.4  --   PLT 188  --    Cardiac Enzymes: No results for input(s): CKTOTAL, CKMB, CKMBINDEX, TROPONINI in the last 168 hours. CBG: Recent Labs  Lab 11/22/18 0940  GLUCAP 115*   Iron Studies: No results for input(s): IRON, TIBC, TRANSFERRIN, FERRITIN in the last 72 hours. Studies/Results: Ct Head Wo Contrast  Result Date: 11/22/2018 CLINICAL DATA:  Found with altered mental status and difficulty breathing. EXAM: CT HEAD WITHOUT CONTRAST TECHNIQUE: Contiguous axial images were obtained from the base of the skull through the vertex without intravenous contrast. COMPARISON:  None. FINDINGS: Brain: Age related atrophy. Chronic small-vessel ischemic changes of the white matter. 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 Physiologic basal ganglia calcification. No sign of acute infarction, mass lesion, hemorrhage, hydrocephalus or extra-axial collection. Vascular: There is atherosclerotic calcification of the major vessels at the base of the brain. Skull: Negative Sinuses/Orbits: Scattered opacified ethmoid air cells. Orbits negative. Other: None IMPRESSION: No acute intracranial finding. Atrophy and chronic small-vessel ischemic change of the white matter. 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   On: 11/22/2018 09:26   Dg Chest Portable 1  View  Result Date: 11/22/2018 CLINICAL DATA:  Intubated for shortness of breath. EXAM: PORTABLE CHEST 1 VIEW COMPARISON:  None. FINDINGS: Endotracheal tube is well positioned with tip approximately 5 cm above the carina. Enteric tube passes below the diaphragm. Lungs are hyperexpanded. Coarse lung markings bilaterally suggesting some degree of chronic interstitial lung disease/fibrosis. Superimposed edema versus pneumonia in the LEFT perihilar lung. Small bilateral pleural effusions, versus more likely chronic bilateral pleural thickening. No pneumothorax seen. No acute appearing osseous abnormality. IMPRESSION: 1. Endotracheal tube well positioned with tip approximately 5 cm above the carina. 2. Enteric tube passes below the diaphragm. 3. Hyperexpanded lungs indicating COPD. Coarse lung markings bilaterally suggesting some degree of chronic interstitial lung disease/fibrosis. 4. Superimposed edema versus pneumonia in the LEFT perihilar lung. Electronically Signed   By: Franki Cabot M.D.   On: 11/22/2018 06:04    ROS: As per HPI otherwise negative.    Physical Exam: Vitals:   11/22/18 0806 11/22/18 0815 11/22/18 0900 11/22/18 0955  BP:  (!) 161/106 (!) 166/104 (!) 185/114  Pulse: 89 88 92 (!) 102  Resp: (!) 22 20 (!) 22 (!) 22  Temp:      TempSrc:      SpO2: 100% 100% 100% 100%  Height:         General: Thin, older female in no acute distress. Head: Normocephalic, atraumatic, sclera non-icteric, mucus membranes are moist Neck: Supple. JVD elevated 1/2 to mandible.  Lungs: Orally intubated, bilateral breath sounds symmetrical chest excursion with bibasilar crackles, coarse scattered breath sounds. . Heart: RRR with S1 S2. No murmurs, rubs, or gallops appreciated. SR-ST on monitor.  Abdomen: OG tube in place. Hypoactive BS, S, NT M-S:  Strength and tone appear normal for age. Lower extremities:without edema or ischemic changes, no open wounds  Neuro: Pupils pinpoint equal, very sluggish  reaction to light, sedated on propofol   Dialysis Access: L AVG strong bruit  Dialysis Orders: T,Th,S at [place] [facility] Will request records tomorrow.   Assessment/Plan: 1. Acute Respiratory Failure-intubated, mechanically ventilated, per PCCM.   2. Acute volume overload D/T missed dialysis-Urgent HD today-very hypertensive at  start of HD now hypotensive. Attempting UFG 3.0 liters.  3. Elevated troponin probably D/T demand ischemia/volume overload. Per PCCM.  4. Hyperkalemia-K+ 7.4 on adm. Now 6.4 using 2.0 K bath. Follow K+ 5. Possible PNA-ABX 6.  ESRD - T,Th,S via AVG. No HD since 11/18/18. Serial HD as tolerated for volume removal.  7.  Hypertension/volume  - See # 2. Get records from OP HD center for list of home medications. 8.  Anemia  - HGB 10.4 Follow HGB 9.  Metabolic bone disease - NPO at present. Ca 8.9. Getting records from OP HD center.  10.  Nutrition - NPO 11.  H/O interstitial lung disease 12.  H/O COPD 43.  Hyperglycemia on adm. Per primary  Jimmye Norman. Owens Shark, NP-C 11/22/2018, 9:56 AM  Opp

## 2018-11-22 NOTE — Procedures (Signed)
I evaluated the patient while on hemodialysis.  I reviewed the patient's treatment plan.  Patient tolerating dialysis well.  Discussed with RN.  

## 2018-11-22 NOTE — H&P (Signed)
NAME:  Molly Roberts, MRN:  [id number], DOB:  [birth year], LOS: 0 ADMISSION DATE:  11/22/2018, CONSULTATION DATE:  11/22/18  REFERRING MD:  EDP Dr. Leonides Schanz , CHIEF COMPLAINT:  Respiratory Failure   Brief History   68 yo female former smoker with known ESRD on hemodialysis (T/TH/Sat) with respiratory distress found to be severely hypoxic with O2 sats 58% requiring intubation . Patient missed 1 dialysis session . PCCM consulted for admission   History of present illness   68 year old female with history of end-stage renal disease on dialysis Tuesday Thursday and Saturday.  She has medical history significant for COPD sarcoid and dementia.  Patient's family called EMS as patient was having some respiratory distress.  On arrival EMS noted O2 saturations 58% on room air.  She was placed on a nonrebreather with improvement into the 80s and then on place on CPAP however patient was not not following commands or talking.  Blood sugars were in the 300s.  Report says the patient was still last dialyzed Wednesday, July 15 prior to going out of town on Thursday, July 16.  She missed her dialysis session on Saturday, July 18. ER work-up showed chest x-ray COPD changes and chronic interstitial lung disease.  Increased markings with edema versus pneumonia in the left perihilar area.  SARS-CoV-2 was negative.  BNP was elevated at 974.  Lactic acid 1.9.  Patient was started on empiric antibiotics with Maxipime and vancomycin.  She is currently on sedation with propofol.   Past Medical History  COPD , Sarcoid , Dementia , ESRD   Significant Hospital Events     Consults:    Procedures:  7/19 ETT  Significant Diagnostic Tests:    Micro Data:  7/19 BC x 2   Antimicrobials:  7/19 Vanc 7/19 Maxipime   Interim history/subjective:  Sedated on vent   Objective   Blood pressure (!) 158/104, pulse 89, temperature (!) 94.5 F (34.7 C), temperature source Rectal, resp. rate (!) 22, height 5\' 8"  (1.727 m), SpO2  100 %.    Vent Mode: PRVC FiO2 (%):  [40 %-100 %] 40 % Set Rate:  [16 bmp-22 bmp] 22 bmp Vt Set:  [510 mL] 510 mL PEEP:  [5 cmH20] 5 cmH20 Plateau Pressure:  [31 cmH20-33 cmH20] 31 cmH20  No intake or output data in the 24 hours ending 11/22/18 0810 There were no vitals filed for this visit.  Examination: General: Elderly female sedated on vent HENT: AT/[place], ETT in place Lungs: Coarse breath sounds bilaterally Cardiovascular: S1-S2, no MRG Abdomen: Soft, bowel sounds hypoactive Extremities: Intact, no edema Neuro: Sedated on vent GU:   Resolved Hospital Problem list     Assessment & Plan:  Acute Hypoxic Respiratory Failure  Suspected Left sided PNA  COPD  Sarcoid -home prednisone 5-10mg     Plan  Vent support  VAP  Check ABG  Assess for daily SBT/wean  Cont empiric abx  Pulmonary hygiene  Cont prednisone 10mg   .   ESRD -missed HD x 1 day  Hyperkalemia   Plan  Renal consult  Repeat bmet in 1200   Elevated  Troponin  HTN   Plan  Tr troponin  Consider cards consult  Check Echo  Home coreg   Anemia   Plan  Tr cbc      Best practice:  Diet: npo Pain/Anxiety/Delirium protocol (if indicated): in place  VAP protocol (if indicated): in place  DVT prophylaxis: hep GI prophylaxis: pepcid Glucose control: ssi Mobility: br Code Status: full Family  Communication:  Disposition: icu  Labs   CBC: Recent Labs  Lab 11/22/18 0547 11/22/18 0552  WBC 16.0*  --   NEUTROABS 14.5*  --   HGB 10.4* 10.9*  HCT 34.9* 32.0*  MCV 96.4  --   PLT 188  --     Basic Metabolic Panel: Recent Labs  Lab 11/22/18 0547 11/22/18 0552  NA 141 138  K 7.4* 6.7*  CL 101  --   CO2 24  --   GLUCOSE 244*  --   BUN 80*  --   CREATININE 12.81*  --   CALCIUM 8.9  --    GFR: CrCl cannot be calculated (Unknown ideal weight.). Recent Labs  Lab 11/22/18 0547 11/22/18 0604  WBC 16.0*  --   LATICACIDVEN  --  1.9    Liver Function Tests: Recent Labs  Lab  11/22/18 0547  AST 72*  ALT 45*  ALKPHOS 121  BILITOT 0.7  PROT 6.5  ALBUMIN 3.6   No results for input(s): LIPASE, AMYLASE in the last 168 hours. No results for input(s): AMMONIA in the last 168 hours.  ABG    Component Value Date/Time   PHART 7.270 (L) 11/22/2018 0552   PCO2ART 57.6 (H) 11/22/2018 0552   PO2ART 476.0 (H) 11/22/2018 0552   HCO3 27.3 11/22/2018 0552   TCO2 29 11/22/2018 0552   ACIDBASEDEF 1.0 11/22/2018 0552   O2SAT 100.0 11/22/2018 0552     Coagulation Profile: No results for input(s): INR, PROTIME in the last 168 hours.  Cardiac Enzymes: No results for input(s): CKTOTAL, CKMB, CKMBINDEX, TROPONINI in the last 168 hours.  HbA1C: No results found for: HGBA1C  CBG: No results for input(s): GLUCAP in the last 168 hours.  Review of Systems:   Unable to obtain as sedated on vent   Past Medical History  She,  has no past medical history on file.   Surgical History      Social History      Family History   Her family history is not on file.   Allergies Not on File   Home Medications  Prior to Admission medications   Medication Sig Start Date End Date Taking? Authorizing Provider  acetaminophen (TYLENOL) 500 MG tablet Take 500-1,000 mg by mouth every 6 (six) hours as needed for mild pain or fever.   Yes [provider]  aspirin EC 81 MG tablet Take 81 mg by mouth daily.   Yes [provider]  calcium carbonate (TUMS EX) 750 MG chewable tablet Chew 1-2 tablets by mouth daily as needed for heartburn.   Yes [provider]  carvedilol (COREG) 12.5 MG tablet Take 12.5 mg by mouth 2 (two) times daily with a meal.   Yes [provider]  clonazePAM (KLONOPIN) 0.5 MG tablet Take 0.5 mg by mouth 2 (two) times daily as needed for anxiety.   Yes [provider]  donepezil (ARICEPT) 5 MG tablet Take 10 mg by mouth at bedtime.    Yes [provider]  famotidine (PEPCID) 20 MG tablet Take 20 mg by mouth  daily.   Yes [provider]  ferrous sulfate 325 (65 FE) MG tablet Take 325 mg by mouth daily with breakfast.   Yes [provider]  labetalol (NORMODYNE) 200 MG tablet Take 100-200 mg by mouth 2 (two) times daily.   Yes [provider]  memantine (NAMENDA XR) 28 MG CP24 24 hr capsule Take 28 mg by mouth daily.   Yes [provider]  montelukast (SINGULAIR) 10 MG tablet Take 10 mg by mouth at bedtime.   Yes [provider]  ondansetron (ZOFRAN) 4 MG tablet Take 4 mg by mouth every 8 (eight) hours as needed for nausea or vomiting.   Yes [provider]  pantoprazole (PROTONIX) 40 MG tablet Take 40 mg by mouth daily.   Yes [provider]  predniSONE (DELTASONE) 10 MG tablet Take 5-10 mg by mouth daily with breakfast.   Yes [provider]  Valerian 500 MG CAPS Take 1-2 capsules by mouth at bedtime as needed (sleep).   Yes [provider]     Critical care time:      Anushree Dorsi NP-C  [facility]  [phone]  11/22/2018

## 2018-11-22 NOTE — Progress Notes (Signed)
  Echocardiogram 2D Echocardiogram has been performed.  Darlina Sicilian M 11/22/2018, 2:44 PM

## 2018-11-22 NOTE — ED Provider Notes (Signed)
TIME SEEN: 6:23 AM  CHIEF COMPLAINT: Respiratory distress  HPI: Patient is a 68 year old female with history of end-stage renal disease on hemodialysis Tuesday, Thursday and Saturday, COPD, sarcoidosis, dementia who presents to the emergency department with EMS for respiratory distress.  History is provided by patient's family and EMS.  EMS states on their arrival patient was in respiratory distress with sats of 58% on room air.  She improved with nonrebreather into the 80s and then improved on CPAP but was not talking or following commands.  Blood glucose was in the 300s.  Family reports that she was last dialyzed on Wednesday, July 15 prior to going out of town on Thursday the 16th.  They report that her dialysis coordinator was unable to get her set up with dialysis here in [place] while visiting family due to the [place] pandemic.  She missed her dialysis on Saturday.  Family reports that this is happened before about a year ago and she had to be intubated.  They deny any known fevers or cough.  States she had a negative COVID test about 2 weeks ago.  EMS reports that they gave 0.3 mg of IM epinephrine and 2 g of IV magnesium.  Daughter - [phone]  ROS: Level 5 caveat for respiratory distress  PAST MEDICAL HISTORY/PAST SURGICAL HISTORY:  No past medical history on file.  MEDICATIONS:  Prior to Admission medications   Medication Sig Start Date End Date Taking? Authorizing Provider  acetaminophen (TYLENOL) 500 MG tablet Take 500-1,000 mg by mouth every 6 (six) hours as needed for mild pain or fever.   Yes [provider]  aspirin EC 81 MG tablet Take 81 mg by mouth daily.   Yes [provider]  calcium carbonate (TUMS EX) 750 MG chewable tablet Chew 1-2 tablets by mouth daily as needed for heartburn.   Yes [provider]  carvedilol (COREG) 12.5 MG tablet Take 12.5 mg by mouth 2 (two) times daily with a meal.   Yes [provider]  clonazePAM  (KLONOPIN) 0.5 MG tablet Take 0.5 mg by mouth 2 (two) times daily as needed for anxiety.   Yes [provider]  donepezil (ARICEPT) 5 MG tablet Take 10 mg by mouth at bedtime.    Yes [provider]  famotidine (PEPCID) 20 MG tablet Take 20 mg by mouth daily.   Yes [provider]  ferrous sulfate 325 (65 FE) MG tablet Take 325 mg by mouth daily with breakfast.   Yes [provider]  labetalol (NORMODYNE) 200 MG tablet Take 100-200 mg by mouth 2 (two) times daily.   Yes [provider]  memantine (NAMENDA XR) 28 MG CP24 24 hr capsule Take 28 mg by mouth daily.   Yes [provider]  montelukast (SINGULAIR) 10 MG tablet Take 10 mg by mouth at bedtime.   Yes [provider]  ondansetron (ZOFRAN) 4 MG tablet Take 4 mg by mouth every 8 (eight) hours as needed for nausea or vomiting.   Yes [provider]  pantoprazole (PROTONIX) 40 MG tablet Take 40 mg by mouth daily.   Yes [provider]  predniSONE (DELTASONE) 10 MG tablet Take 5-10 mg by mouth daily with breakfast.   Yes [provider]  Valerian 500 MG CAPS Take 1-2 capsules by mouth at bedtime as needed (sleep).   Yes [provider]    ALLERGIES:  Not on File  SOCIAL HISTORY:  Social History   Tobacco Use  .  Smoking status: Not on file  Substance Use Topics  . Alcohol use: Not on file    FAMILY HISTORY: No family history on file.  EXAM: BP (!) 214/125   Pulse 91   Temp (!) 94.3 F (34.6 C) (Rectal)   Resp 16   Ht 5\' 8"  (1.727 m)   SpO2 100%  CONSTITUTIONAL: Thin, in severe respiratory distress, will open eyes intermittently but does not answer questions or follow commands HEAD: Normocephalic EYES: Conjunctivae clear, pupils appear equal, EOMI ENT: normal nose; moist mucous membranes NECK: Supple CARD: Regular and tachycardic; S1 and S2 appreciated; no murmurs, no clicks, no rubs, no gallops RESP: Patient is tachypneic and in  severe respiratory distress.  She has diminished aeration diffusely.  She has expiratory and inspiratory wheezes.  No rhonchi or rales appreciated.  Patient satting 100% on nonrebreather.  Patient is not talking. ABD/GI: Normal bowel sounds; non-distended; soft BACK:  The back appears normal and is non-tender to palpation, there is no CVA tenderness EXT: No peripheral edema noted.  No deformity noted to her extremities. SKIN: Normal color for age and race; warm; no rash on exposed skin NEURO: Patient will open eyes spontaneously.  Does not answer questions or follow commands.  GCS 6.   MEDICAL DECISION MAKING: Patient here with severe respiratory distress.  I suspect that this is related to pulmonary edema from missed dialysis.  Patient has a GCS of 6 here.  Decision was made immediately to intubate patient for airway protection and for increased work of breathing.  Chest x-ray concerning for edema versus possible pneumonia in the perihilar region of the left lung.  She also has significantly hyperexpanded lungs consistent with COPD.  She will receive IV Solu-Medrol here as well.  Will obtain labs, ABG.  Patient will need admission.  Will obtain head CT given altered mental status but suspect it is related to her respiratory status.  ED PROGRESS: Rectal temp found to be 94.3.  Will start broad-spectrum antibiotics and obtain cultures for possible sepsis.  Patient placed under a bair hugger.  6:25 AM ABG shows respiratory acidosis.  She has hyperkalemia.  Will give albuterol, insulin and D50.  She does not have EKG changes currently.  D/w Dr. Oletta Darter with critical care.  He states morning team will see patient for admission.  Appreciate critical care help.   6:54 AM  D/w Dr. Maryjane Hurter with nephrology.  They will see patient for dialysis today.  Appreciate nephrology help.   Patient has been extremely hypertensive here.  This improves with increased sedation.     EKG  Interpretation  Date/Time:  Sunday November 22 2018 05:10:06 EDT Ventricular Rate:  105 PR Interval:    QRS Duration: 103 QT Interval:  360 QTC Calculation: 476 R Axis:   77 Text Interpretation:  Sinus tachycardia Probable LVH with secondary repol abnrm No old tracing to compare Confirmed by Marquerite Forsman, Cyril Mourning [phone]) on 11/22/2018 5:58:22 AM        CRITICAL CARE Performed by: Cyril Mourning Jacqueline Spofford   Total critical care time: 65 minutes  Critical care time was exclusive of separately billable procedures and treating other patients.  Critical care was necessary to treat or prevent imminent or life-threatening deterioration.  Critical care was time spent personally by me on the following activities: development of treatment plan with patient and/or surrogate as well as nursing, discussions with consultants, evaluation of patient's response to treatment, examination of patient, obtaining history from patient or surrogate, ordering and performing treatments and  interventions, ordering and review of laboratory studies, ordering and review of radiographic studies, pulse oximetry and re-evaluation of patient's condition.   Procedure Name: Intubation Date/Time: 11/22/2018 6:15 AM Performed by: Kylin Dubs, Delice Bison, DO Pre-anesthesia Checklist: Patient identified, Patient being monitored, Emergency Drugs available, Timeout performed and Suction available Oxygen Delivery Method: Non-rebreather mask Preoxygenation: Pre-oxygenation with 100% oxygen Induction Type: Rapid sequence Laryngoscope Size: Glidescope and 3 Grade View: Grade I Tube size: 7.5 mm Number of attempts: 1 Placement Confirmation: ETT inserted through vocal cords under direct vision,  CO2 detector and Breath sounds checked- equal and bilateral Secured at: 23 cm Tube secured with: ETT holder         Bev Drennen, Delice Bison, DO 11/22/18 4742

## 2018-11-22 NOTE — ED Notes (Signed)
Lab drawing blood

## 2018-11-22 NOTE — ED Notes (Signed)
Informed Provider of rectal temp, bair hugger applied.

## 2018-11-22 NOTE — ED Notes (Signed)
HD RN called, stated she would meet pt upstairs

## 2018-11-22 NOTE — ED Triage Notes (Addendum)
Per EMS, visting from out of town.  Missed dialysis to celebrate birthday, Not sure of dialysis schedule or last appointment kept.  Family found pt unresponsive struggling to breath, initial on 58% on RA.  EMS gave 1 Epi, 2 g Mag.

## 2018-11-22 NOTE — Progress Notes (Signed)
eLink Physician-Brief Progress Note Patient Name: Molly Roberts DOB: [birth year] MRN: [id number]   Date of Service  11/22/2018  HPI/Events of Note  Fever to 102.4 F - AST and ALT both elevated. Will avoid ordered Tylenol.   eICU Interventions  Will order: 1. Cooling blanket PRN.      Intervention Category Major Interventions: Infection - evaluation and management  Sommer,Steven Eugene 11/22/2018, 10:08 PM

## 2018-11-22 NOTE — Progress Notes (Signed)
Uncontrolled hypertension  PRN Apresoline ordered

## 2018-11-22 NOTE — ED Notes (Signed)
Family [phone]

## 2018-11-23 ENCOUNTER — Inpatient Hospital Stay (HOSPITAL_COMMUNITY): Payer: Medicare Other

## 2018-11-23 LAB — BASIC METABOLIC PANEL
Anion gap: 16 — ABNORMAL HIGH (ref 5–15)
BUN: 43 mg/dL — ABNORMAL HIGH (ref 8–23)
CO2: 22 mmol/L (ref 22–32)
Calcium: 9 mg/dL (ref 8.9–10.3)
Chloride: 93 mmol/L — ABNORMAL LOW (ref 98–111)
Creatinine, Ser: 7.34 mg/dL — ABNORMAL HIGH (ref 0.44–1.00)
GFR calc Af Amer: 6 mL/min — ABNORMAL LOW (ref >60)
GFR calc non Af Amer: 5 mL/min — ABNORMAL LOW (ref >60)
Glucose, Bld: 91 mg/dL (ref 70–99)
Potassium: 3.7 mmol/L (ref 3.5–5.1)
Sodium: 131 mmol/L — ABNORMAL LOW (ref 135–145)

## 2018-11-23 LAB — CBC
HCT: 34.1 % — ABNORMAL LOW (ref 36.0–46.0)
HCT: 30.8 % — ABNORMAL LOW (ref 36.0–46.0)
Hemoglobin: 11.1 g/dL — ABNORMAL LOW (ref 12.0–15.0)
Hemoglobin: 10.2 g/dL — ABNORMAL LOW (ref 12.0–15.0)
MCH: 29 pg (ref 26.0–34.0)
MCH: 29.8 pg (ref 26.0–34.0)
MCHC: 32.6 g/dL (ref 30.0–36.0)
MCHC: 33.1 g/dL (ref 30.0–36.0)
MCV: 89 fL (ref 80.0–100.0)
MCV: 90.1 fL (ref 80.0–100.0)
Platelets: 217 10*3/uL (ref 150–400)
Platelets: 193 10*3/uL (ref 150–400)
RBC: 3.83 MIL/uL — ABNORMAL LOW (ref 3.87–5.11)
RBC: 3.42 MIL/uL — ABNORMAL LOW (ref 3.87–5.11)
RDW: 17.2 % — ABNORMAL HIGH (ref 11.5–15.5)
RDW: 17.5 % — ABNORMAL HIGH (ref 11.5–15.5)
WBC: 15.4 10*3/uL — ABNORMAL HIGH (ref 4.0–10.5)
WBC: 15.9 10*3/uL — ABNORMAL HIGH (ref 4.0–10.5)
nRBC: 0.1 % (ref 0.0–0.2)
nRBC: 0 % (ref 0.0–0.2)

## 2018-11-23 LAB — GLUCOSE, CAPILLARY
Glucose-Capillary: 107 mg/dL — ABNORMAL HIGH (ref 70–99)
Glucose-Capillary: 60 mg/dL — ABNORMAL LOW (ref 70–99)
Glucose-Capillary: 67 mg/dL — ABNORMAL LOW (ref 70–99)
Glucose-Capillary: 70 mg/dL (ref 70–99)
Glucose-Capillary: 86 mg/dL (ref 70–99)
Glucose-Capillary: 86 mg/dL (ref 70–99)
Glucose-Capillary: 86 mg/dL (ref 70–99)
Glucose-Capillary: 98 mg/dL (ref 70–99)

## 2018-11-23 LAB — HIV ANTIBODY (ROUTINE TESTING W REFLEX): HIV Screen 4th Generation wRfx: NONREACTIVE

## 2018-11-23 LAB — HEPATITIS B SURFACE ANTIGEN: Hepatitis B Surface Ag: NEGATIVE

## 2018-11-23 MED ORDER — DEXMEDETOMIDINE HCL IN NACL 400 MCG/100ML IV SOLN
0.4000 ug/kg/h | INTRAVENOUS | Status: DC
  Administered 2018-11-23: 17:00:00 0.5 ug/kg/h via INTRAVENOUS
  Administered 2018-11-24: 1 ug/kg/h via INTRAVENOUS
  Filled 2018-11-23 (×2): qty 100

## 2018-11-23 MED ORDER — HALOPERIDOL LACTATE 5 MG/ML IJ SOLN
5.0000 mg | Freq: Four times a day (QID) | INTRAMUSCULAR | Status: DC | PRN
  Administered 2018-11-23 – 2018-11-25 (×4): 5 mg via INTRAVENOUS
  Filled 2018-11-23 (×5): qty 1

## 2018-11-23 MED ORDER — ISOSORB DINITRATE-HYDRALAZINE 20-37.5 MG PO TABS: 1.0000 | ORAL_TABLET | Freq: Three times a day (TID) | ORAL | Status: DC

## 2018-11-23 MED ORDER — ISOSORB DINITRATE-HYDRALAZINE 20-37.5 MG PO TABS
1.0000 | ORAL_TABLET | Freq: Three times a day (TID) | ORAL | Status: DC
  Administered 2018-11-24 – 2018-11-26 (×7): 1
  Filled 2018-11-23 (×7): qty 1

## 2018-11-23 MED ORDER — SODIUM CHLORIDE 0.9 % IV SOLN: 100.0000 mL | INTRAVENOUS | Status: DC | PRN

## 2018-11-23 MED ORDER — ORAL CARE MOUTH RINSE
15.0000 mL | Freq: Two times a day (BID) | OROMUCOSAL | Status: DC
  Administered 2018-11-23 – 2018-11-28 (×10): 15 mL via OROMUCOSAL

## 2018-11-23 MED ORDER — BUDESONIDE 0.25 MG/2ML IN SUSP: 0.2500 mg | Freq: Four times a day (QID) | RESPIRATORY_TRACT | Status: DC

## 2018-11-23 MED ORDER — BUDESONIDE 0.25 MG/2ML IN SUSP
0.2500 mg | Freq: Four times a day (QID) | RESPIRATORY_TRACT | Status: DC
  Administered 2018-11-23: 0.25 mg via RESPIRATORY_TRACT
  Filled 2018-11-23: qty 2

## 2018-11-23 MED ORDER — DEXTROSE 50 % IV SOLN
INTRAVENOUS | Status: AC
Start: 2018-11-23 — End: 2018-11-23
  Administered 2018-11-23: 50 mL
  Filled 2018-11-23: qty 50

## 2018-11-23 MED ORDER — DEXTROSE 50 % IV SOLN
INTRAVENOUS | Status: AC
Start: 2018-11-23 — End: 2018-11-23
  Administered 2018-11-23: 25 mL via INTRAVENOUS
  Filled 2018-11-23: qty 50

## 2018-11-23 MED ORDER — DEXMEDETOMIDINE HCL IN NACL 400 MCG/100ML IV SOLN: 0.2000 ug/kg/h | INTRAVENOUS | Status: DC

## 2018-11-23 MED ORDER — ALBUTEROL SULFATE (2.5 MG/3ML) 0.083% IN NEBU
2.5000 mg | INHALATION_SOLUTION | RESPIRATORY_TRACT | Status: DC | PRN
  Administered 2018-11-24: 2.5 mg via RESPIRATORY_TRACT
  Filled 2018-11-23: qty 3

## 2018-11-23 MED ORDER — NIFEDIPINE ER OSMOTIC RELEASE 60 MG PO TB24: 60.0000 mg | ORAL_TABLET | Freq: Every day | ORAL | Status: DC

## 2018-11-23 MED ORDER — IBUPROFEN 100 MG/5ML PO SUSP
400.0000 mg | Freq: Once | ORAL | Status: AC
  Administered 2018-11-23: 400 mg
  Filled 2018-11-23: qty 20

## 2018-11-23 MED ORDER — SENNOSIDES 8.8 MG/5ML PO SYRP: 10.0000 mL | ORAL_SOLUTION | Freq: Two times a day (BID) | ORAL | Status: DC

## 2018-11-23 MED ORDER — SENNOSIDES 8.8 MG/5ML PO SYRP
10.0000 mL | ORAL_SOLUTION | Freq: Two times a day (BID) | ORAL | Status: DC
  Administered 2018-11-24 – 2018-11-25 (×3): 10 mL
  Filled 2018-11-23 (×6): qty 10

## 2018-11-23 NOTE — Progress Notes (Signed)
Renal Navigator spoke with Molly Roberts/OP HD clinic [facility] in [place], [place] to request records. Records obtained and faxed to inpt HD unit.  Alphonzo Cruise, [place] Renal Navigator [phone]

## 2018-11-23 NOTE — Progress Notes (Signed)
[facility]    NEPHROLOGY PROGRESS NOTE  SUBJECTIVE: Patient sedated, no acute distress.  Extubated this morning.  Continues on Cleviprex   OBJECTIVE:  Vitals:   11/23/18 1000 11/23/18 1100  BP: (!) 147/77 (!) 159/95  Pulse: (!) 108 91  Resp: (!) 25 18  Temp:  98 F (36.7 C)  SpO2: 100% 100%    Intake/Output Summary (Last 24 hours) at 11/23/2018 1136 Last data filed at 11/23/2018 1100 Gross per 24 hour  Intake 879.51 ml  Output 2201 ml  Net -1321.49 ml      General: Sedated NAD HEENT: [place] [place] AT anicteric sclera Neck:  No JVD, no adenopathy CV:  Heart RRR  Lungs: Bibasilar Rales Abd:  abd SNT/ND with normal BS GU:  Bladder non-palpable Extremities:  No LE edema.  Left upper extremity AV graft with a good thrill and bruit Skin:  No skin rash  MEDICATIONS:  . albuterol  10 mg Nebulization Once  . aspirin  325 mg Oral Daily  . budesonide (PULMICORT) nebulizer solution  0.25 mg Nebulization Q6H  . carvedilol  12.5 mg Per Tube BID WC  . chlorhexidine gluconate (MEDLINE KIT)  15 mL Mouth Rinse BID  . Chlorhexidine Gluconate Cloth  6 each Topical Q0600  . famotidine  20 mg Per Tube Daily  . heparin  5,000 Units Subcutaneous Q8H  . insulin aspart  1-3 Units Subcutaneous Q4H  . isosorbide-hydrALAZINE  1 tablet Per Tube TID  . mouth rinse  15 mL Mouth Rinse 10 times per day  . predniSONE  10 mg Per Tube Q breakfast  . sennosides  10 mL Per Tube BID       LABS:   CBC Latest Ref Rng & Units 11/23/2018 11/22/2018 11/22/2018  WBC 4.0 - 10.5 K/uL 15.4(H) - 16.0(H)  Hemoglobin 12.0 - 15.0 g/dL 11.1(L) 10.9(L) 10.4(L)  Hematocrit 36.0 - 46.0 % 34.1(L) 32.0(L) 34.9(L)  Platelets 150 - 400 K/uL 217 - 188    CMP Latest Ref Rng & Units 11/23/2018 11/22/2018 11/22/2018  Glucose 70 - 99 mg/dL 91 106(H) -  BUN 8 - 23 mg/dL 43(H) 22 -  Creatinine 0.44 - 1.00 mg/dL 7.34(H) 4.50(H) -  Sodium 135 - 145 mmol/L 131(L) 135 138  Potassium 3.5 - 5.1 mmol/L 3.7 3.6 6.7(HH)   Chloride 98 - 111 mmol/L 93(L) 95(L) -  CO2 22 - 32 mmol/L 22 23 -  Calcium 8.9 - 10.3 mg/dL 9.0 10.8(H) -  Total Protein 6.5 - 8.1 g/dL - - -  Total Bilirubin 0.3 - 1.2 mg/dL - - -  Alkaline Phos 38 - 126 U/L - - -  AST 15 - 41 U/L - - -  ALT 0 - 44 U/L - - -    Lab Results  Component Value Date   CALCIUM 9.0 11/23/2018   CAION 1.16 11/22/2018   PHOS 2.5 11/22/2018    No results found for: COLORURINE, APPEARANCEUR, LABSPEC, PHURINE, GLUCOSEU, HGBUR, BILIRUBINUR, KETONESUR, PROTEINUR, UROBILINOGEN, NITRITE, LEUKOCYTESUR    Component Value Date/Time   PHART 7.270 (L) 11/22/2018 0552   PCO2ART 57.6 (H) 11/22/2018 0552   PO2ART 476.0 (H) 11/22/2018 0552   HCO3 27.3 11/22/2018 0552   TCO2 29 11/22/2018 0552   ACIDBASEDEF 1.0 11/22/2018 0552   O2SAT 100.0 11/22/2018 0552    No results found for: IRON, TIBC, FERRITIN, IRONPCTSAT     ASSESSMENT/PLAN:     68 year old female with end-stage renal disease on hemodialysis Tuesday, Thursday, Saturday at [facility] for  [facility] center in [facility] who missed dialysis to go on vacation for her birthday.  Her last dialysis as an outpatient was on July 15.  She apparently did not have any transient dialysis plan schedule.  She came to the emergency department on 7/19 with shortness of breath requiring intubation.  She was notably hyperkalemic with a potassium of 7.4.  1.  End-stage renal disease on hemodialysis.  Is normally a Tuesday, Thursday, Saturday dialysis patient.  Volume status looks somewhat better today, but continues to be fluid overloaded.  We will plan an additional 3-hour treatment today.  2.  Hypertensive emergency.  She remains on Cleviprex.  Would recommend restarting her oral medications when she is more awake.  3.  Hyperkalemia.  Improved with dialysis.  4.  Acute hypoxic and hypercapnic respiratory failure.  Secondary to fluid overload.  Improved.  She was extubated this morning.  5.  Anemia.   Hemoglobin is above goal.  ESA is held in the setting of high hemoglobin and hypertensive emergency.  6.  Metabolic bone disease.  Her phosphorus is stable at 2.5.  Can resume treatment as an outpatient.    [place], DO, [place]

## 2018-11-23 NOTE — Progress Notes (Signed)
[name] Daughter [phone]   Called above number, went straight to voicemail and voicemail full.

## 2018-11-23 NOTE — Progress Notes (Signed)
NAME:  Molly Roberts, MRN:  [id number], DOB:  May 08, 1950, LOS: 1 ADMISSION DATE:  11/22/2018, CONSULTATION DATE:  11/22/18  REFERRING MD:  EDP Dr. Leonides Schanz , CHIEF COMPLAINT:  Respiratory Failure   Brief History   68 yo female former smoker with known ESRD on hemodialysis (T/TH/Sat) with respiratory distress found to be severely hypoxic with O2 sats 58% requiring intubation . Patient missed 1 dialysis session . PCCM consulted for admission   History of present illness   68 year old female with history of end-stage renal disease on dialysis Tuesday Thursday and Saturday.  She has medical history significant for COPD sarcoid and dementia.  Patient's family called EMS as patient was having some respiratory distress.  On arrival EMS noted O2 saturations 58% on room air.  She was placed on a nonrebreather with improvement into the 80s and then on place on CPAP however patient was not not following commands or talking.  Blood sugars were in the 300s.  Report says the patient was still last dialyzed Wednesday, July 15 prior to going out of town on Thursday, July 16.  She missed her dialysis session on Saturday, July 18. ER work-up showed chest x-ray COPD changes and chronic interstitial lung disease.  Increased markings with edema versus pneumonia in the left perihilar area.  SARS-CoV-2 was negative.  BNP was elevated at 974.  Lactic acid 1.9.  Patient was started on empiric antibiotics with Maxipime and vancomycin.  She is currently on sedation with propofol.   Past Medical History  COPD , Sarcoid , Dementia , ESRD   Significant Hospital Events   7/19 admitted, received iHD 7/20 changing sedation and anti HTN medications, hoping to wake up and wean vent   Consults:  Nephrology   Procedures:  7/19 ETT  Significant Diagnostic Tests:  CXR 7/19 > ETT present. Hyperinflated lungs, coarse lung markings bilaterally.  Bilateral pleural effusions.  CXR 7/20> ETT present, hyperinflated lungs, bilateral  pleural effusions, bilateral coarse lung markings with some interval improvement to bilateral ASD   Micro Data:  7/19 BC x 2  7/19 Tracheal asp > rare WBC, rare GPC in clusters  7/19 SARS CoV2> negative   Antimicrobials:  7/19 Vanc 7/19 Maxipime   Interim history/subjective:  Is very sedated, remains intubated and remains on cleviprex gtt  Febrile overnight to 102.2   Objective   Blood pressure 133/70, pulse (!) 103, temperature 99.3 F (37.4 C), temperature source Oral, resp. rate (!) 22, height 5\' 8"  (1.727 m), weight 48.8 kg, SpO2 100 %.    Vent Mode: PRVC FiO2 (%):  [40 %] 40 % Set Rate:  [22 bmp] 22 bmp Vt Set:  [510 mL] 510 mL PEEP:  [5 cmH20] 5 cmH20 Plateau Pressure:  [24 cmH20-34 cmH20] 24 cmH20   Intake/Output Summary (Last 24 hours) at 11/23/2018 0724 Last data filed at 11/23/2018 0500 Gross per 24 hour  Intake 896.02 ml  Output 2201 ml  Net -1304.98 ml   Filed Weights   11/22/18 0954 11/22/18 1350 11/23/18 0500  Weight: 51.6 kg 49.5 kg 48.8 kg    Examination: General: Frail, older adult female, intubated, sedated NAD  HENT: NCAT, ETT secure, Pink mmm, trachea midline, anicteric sclera  Lungs: R lobe inspiratory wheeze. Coarse breath sounds bilaterally. Symmetrical chest expansion. No accessory muscle recruitment  Cardiovascular: s1s2 no rgm. No BLE edema  Abdomen: Soft, flat, ndnt, hypoactive bowel sounds  Extremities: L AVG with strong bruit and thrill. Symmetrical bulk and tone. No cyanosis, no clubbing, no  obvious joint deformity  Neuro: RASS -4. Pinpoint pupils. Does not follow commands  Skin: clean, dry, warm, without rash    Resolved Hospital Problem list    Hyperkalemia   Assessment & Plan:   Acute on chronic respiratory failure with hypoxia requiring intubation -COPD, sarcoid (home prednisone 5-10mg ) -Pulm edema in setting of missed HD -Bilateral pleural effusions R>L  -possible L sided PNA in immunocomp host  -COVID-19 neg -Tracheal  aspirate 7/19 with rare WBC, rare GPC in clusters  Plan  WUA/SBT Changing prop to precedex  Adjust PEEP/FiO2 for SpO2 88-92%  Pulm hygiene PRN albuterol, pulmicort  De-escalate abx when possible  Cont prednisone 10mg   .  AM CXR   ESRD -presents after missing HD -OP T/R/Sa HD Hyperkalemia, resolved  Plan  Nephrology following, appreciate recs and management of iHD  BMP 7/20 still pending, follow up when resulted Order 7/21 AM BMP   HTN -home hydral 10mg  TID, coreg 12.5mg  BID, bidil 10-37.5 TID, labetalol 200mg  BID  Elevated Troponin -likely demand ischemia  Plan  On Cleviprex gtt, titrate for SBP goal 120-140  Coreg 12.5 BID, adding home Bidil  Continue tele   Anemia -chronic disease, ESRD P Trend CBC, transfuse per unit protocol    Constipation, at risk  P Dc prop, starting precedex  Adding senna   Best practice:  Diet: NPO-- patient has been on propofol and cleviprex which should be providing kcal from lipid amount. Deferring initiation of EN due to lipid containing gtts  and hope for wean to extubation  Pain/Anxiety/Delirium protocol (if indicated): dc prop starting precedex  VAP protocol (if indicated): in place  DVT prophylaxis: hep GI prophylaxis: pepcid Glucose control: ssi Mobility: br Code Status: full Family Communication: pending 7/20  Disposition: icu  Labs   CBC: Recent Labs  Lab 11/22/18 0547 11/22/18 0552 11/23/18 0327  WBC 16.0*  --  15.4*  NEUTROABS 14.5*  --   --   HGB 10.4* 10.9* 11.1*  HCT 34.9* 32.0* 34.1*  MCV 96.4  --  89.0  PLT 188  --  466    Basic Metabolic Panel: Recent Labs  Lab 11/22/18 0547 11/22/18 0552 11/22/18 1445  NA 141 138 135  K 7.4* 6.7* 3.6  CL 101  --  95*  CO2 24  --  23  GLUCOSE 244*  --  106*  BUN 80*  --  22  CREATININE 12.81*  --  4.50*  CALCIUM 8.9  --  10.8*  PHOS  --   --  2.5   GFR: Estimated Creatinine Clearance: 9 mL/min (A) (by C-G formula based on SCr of 4.5 mg/dL (H)). Recent Labs   Lab 11/22/18 0547 11/22/18 0604 11/23/18 0327  WBC 16.0*  --  15.4*  LATICACIDVEN  --  1.9  --     Liver Function Tests: Recent Labs  Lab 11/22/18 0547 11/22/18 1445  AST 72*  --   ALT 45*  --   ALKPHOS 121  --   BILITOT 0.7  --   PROT 6.5  --   ALBUMIN 3.6 3.6   No results for input(s): LIPASE, AMYLASE in the last 168 hours. No results for input(s): AMMONIA in the last 168 hours.  ABG    Component Value Date/Time   PHART 7.270 (L) 11/22/2018 0552   PCO2ART 57.6 (H) 11/22/2018 0552   PO2ART 476.0 (H) 11/22/2018 0552   HCO3 27.3 11/22/2018 0552   TCO2 29 11/22/2018 0552   ACIDBASEDEF 1.0 11/22/2018 0552  O2SAT 100.0 11/22/2018 0552     Coagulation Profile: No results for input(s): INR, PROTIME in the last 168 hours.  Cardiac Enzymes: No results for input(s): CKTOTAL, CKMB, CKMBINDEX, TROPONINI in the last 168 hours.  HbA1C: No results found for: HGBA1C  CBG: Recent Labs  Lab 11/22/18 1536 11/22/18 1955 11/23/18 0023 11/23/18 0223 11/23/18 0411  GLUCAP 86 89 60* 86 70    Critical care time: 50 minutes       Eliseo Gum MSN, AGACNP-BC [place] [phone] If no answer, [phone] 11/23/2018, 7:24 AM

## 2018-11-23 NOTE — Progress Notes (Signed)
Pt. Very restless help called to assist with cannulation for hemodialysis.Marland Kitchen pt. cannulated successfully but remains very restless. Primary nurse Kieth Brightly made aware.

## 2018-11-23 NOTE — Progress Notes (Signed)
Patient more agitated. Chart review shows aricept and namenda.  Will have to speak with family about her functional status more in depth tomorrow.  In interim, can do PRN haldol and precedex drip if this fails.

## 2018-11-23 NOTE — Procedures (Signed)
Extubation Procedure Note  Patient Details:   Name: Molly Roberts DOB: January 11, 1951 MRN: [id number]   Airway Documentation:    Vent end date: 11/23/18 Vent end time: 0955   Evaluation  O2 sats: stable throughout Complications: No apparent complications Patient did tolerate procedure well. Bilateral Breath Sounds: Clear, Diminished   Yes   Patient extubated to 3L [place] per MD order. Positive cuff leak. No stridor noted. RN at bedside. RT will continue to monitor.  Herbie [place] 11/23/2018, 10:00 AM

## 2018-11-23 NOTE — Plan of Care (Signed)
  Problem: Clinical Measurements: Goal: Respiratory complications will improve Outcome: Progressing Pt extubated earlier during the day. Pt able to maintain O2 sats>92% throughout the shift on 2L [place].   Problem: Education: Goal: Knowledge of disease and its progression will improve Outcome: Not Progressing  Pt unable to follow commands, pt very confused and restless requiring bilateral wrist restraints to keep pt from pulling at lines during the shift.

## 2018-11-23 NOTE — Progress Notes (Signed)
Pt hypoglycemic event cbg 60 reported to [place]

## 2018-11-23 NOTE — Progress Notes (Signed)
Patient restless unable to follow commands for dialysis. Dr. Tamala Julian notified orders given

## 2018-11-23 NOTE — Progress Notes (Signed)
eLink Physician-Brief Progress Note Patient Name: Molly Roberts DOB: Oct 04, 1950 MRN: [id number]   Date of Service  11/23/2018  HPI/Events of Note  Fever to 102.4 F - No cooling blankets available in hospital at this time.   eICU Interventions  Will order Motrin Suspension 400 mg per tube X 1.     Intervention Category Major Interventions: Other:  Leul Narramore Cornelia Copa 11/23/2018, 1:06 AM

## 2018-11-24 ENCOUNTER — Inpatient Hospital Stay (HOSPITAL_COMMUNITY): Payer: Medicare Other

## 2018-11-24 LAB — BASIC METABOLIC PANEL
Anion gap: 12 (ref 5–15)
BUN: 22 mg/dL (ref 8–23)
CO2: 26 mmol/L (ref 22–32)
Calcium: 8.4 mg/dL — ABNORMAL LOW (ref 8.9–10.3)
Chloride: 98 mmol/L (ref 98–111)
Creatinine, Ser: 5.03 mg/dL — ABNORMAL HIGH (ref 0.44–1.00)
GFR calc Af Amer: 9 mL/min — ABNORMAL LOW (ref >60)
GFR calc non Af Amer: 8 mL/min — ABNORMAL LOW (ref >60)
Glucose, Bld: 78 mg/dL (ref 70–99)
Potassium: 4.6 mmol/L (ref 3.5–5.1)
Sodium: 136 mmol/L (ref 135–145)

## 2018-11-24 LAB — CULTURE, RESPIRATORY W GRAM STAIN: Culture: NORMAL

## 2018-11-24 LAB — CBC
HCT: 29.6 % — ABNORMAL LOW (ref 36.0–46.0)
Hemoglobin: 9.5 g/dL — ABNORMAL LOW (ref 12.0–15.0)
MCH: 29.4 pg (ref 26.0–34.0)
MCHC: 32.1 g/dL (ref 30.0–36.0)
MCV: 91.6 fL (ref 80.0–100.0)
Platelets: 122 10*3/uL — ABNORMAL LOW (ref 150–400)
RBC: 3.23 MIL/uL — ABNORMAL LOW (ref 3.87–5.11)
RDW: 17.2 % — ABNORMAL HIGH (ref 11.5–15.5)
WBC: 17.2 10*3/uL — ABNORMAL HIGH (ref 4.0–10.5)
nRBC: 0 % (ref 0.0–0.2)

## 2018-11-24 LAB — GLUCOSE, CAPILLARY
Glucose-Capillary: 73 mg/dL (ref 70–99)
Glucose-Capillary: 76 mg/dL (ref 70–99)
Glucose-Capillary: 86 mg/dL (ref 70–99)
Glucose-Capillary: 101 mg/dL — ABNORMAL HIGH (ref 70–99)
Glucose-Capillary: 134 mg/dL — ABNORMAL HIGH (ref 70–99)

## 2018-11-24 LAB — PHOSPHORUS: Phosphorus: 4.6 mg/dL (ref 2.5–4.6)

## 2018-11-24 LAB — TROPONIN I (HIGH SENSITIVITY)
Troponin I (High Sensitivity): 473 ng/L (ref <18)
Troponin I (High Sensitivity): 470 ng/L (ref <18)

## 2018-11-24 LAB — MAGNESIUM: Magnesium: 2 mg/dL (ref 1.7–2.4)

## 2018-11-24 MED ORDER — CLONAZEPAM 0.5 MG PO TABS
0.2500 mg | ORAL_TABLET | Freq: Two times a day (BID) | ORAL | Status: DC
  Administered 2018-11-24 – 2018-11-25 (×3): 0.25 mg via ORAL
  Filled 2018-11-24 (×3): qty 1

## 2018-11-24 MED ORDER — VITAL 1.5 CAL PO LIQD
1000.0000 mL | ORAL | Status: DC
  Administered 2018-11-24: 1000 mL
  Filled 2018-11-24 (×7): qty 1000

## 2018-11-24 MED ORDER — B COMPLEX-C PO TABS
1.0000 | ORAL_TABLET | Freq: Every day | ORAL | Status: DC
  Administered 2018-11-24 – 2018-11-26 (×3): 1
  Filled 2018-11-24 (×3): qty 1

## 2018-11-24 MED ORDER — BUDESONIDE 0.25 MG/2ML IN SUSP
0.2500 mg | Freq: Two times a day (BID) | RESPIRATORY_TRACT | Status: DC
  Administered 2018-11-24 – 2018-11-28 (×7): 0.25 mg via RESPIRATORY_TRACT
  Filled 2018-11-24 (×10): qty 2

## 2018-11-24 MED ORDER — SODIUM CHLORIDE 0.9 % IV SOLN
INTRAVENOUS | Status: DC
  Administered 2018-11-24: 23:00:00 via INTRAVENOUS

## 2018-11-24 MED ORDER — AMLODIPINE BESYLATE 5 MG PO TABS: 5.0000 mg | ORAL_TABLET | Freq: Every day | ORAL | Status: DC

## 2018-11-24 MED ORDER — WHITE PETROLATUM EX OINT
TOPICAL_OINTMENT | CUTANEOUS | Status: AC
  Administered 2018-11-24: 0.2
  Filled 2018-11-24: qty 28.35

## 2018-11-24 NOTE — Progress Notes (Signed)
Pt increasingly agitated/restless, appears very confused, not following commands, or speaking. Pt already in bilateral wrist restraints and waist restraint and still trying to get out of bed, unable to be verbally redirected. Pt given 5mg  haldol PRN and orders received to bilateral leg restraints. Will continue to monitor closely. Clint Bolder, RN 11/24/18 6:11 AM

## 2018-11-24 NOTE — Procedures (Signed)
I was present at this dialysis session. I have reviewed the session itself and made appropriate changes.   L FA AVF, Qb 400, Target UF 2.5L.  BP stable.  Still encephalopathic.  2K bath.   Tentative next HD 7/23.   Filed Weights   11/23/18 0500 11/23/18 1600 11/23/18 1900  Weight: 48.8 kg 49.4 kg 47.3 kg    Recent Labs  Lab 11/22/18 1445  11/24/18 0251  NA 135   < > 136  K 3.6   < > 4.6  CL 95*   < > 98  CO2 23   < > 26  GLUCOSE 106*   < > 78  BUN 22   < > 22  CREATININE 4.50*   < > 5.03*  CALCIUM 10.8*   < > 8.4*  PHOS 2.5  --   --    < > = values in this interval not displayed.    Recent Labs  Lab 11/22/18 0547  11/23/18 0327 11/23/18 1631 11/24/18 0251  WBC 16.0*  --  15.4* 15.9* 17.2*  NEUTROABS 14.5*  --   --   --   --   HGB 10.4*   < > 11.1* 10.2* 9.5*  HCT 34.9*   < > 34.1* 30.8* 29.6*  MCV 96.4  --  89.0 90.1 91.6  PLT 188  --  217 193 122*   < > = values in this interval not displayed.    Scheduled Meds: . albuterol  10 mg Nebulization Once  . aspirin  325 mg Oral Daily  . budesonide (PULMICORT) nebulizer solution  0.25 mg Nebulization BID  . carvedilol  12.5 mg Per Tube BID WC  . Chlorhexidine Gluconate Cloth  6 each Topical Q0600  . famotidine  20 mg Per Tube Daily  . heparin  5,000 Units Subcutaneous Q8H  . insulin aspart  1-3 Units Subcutaneous Q4H  . isosorbide-hydrALAZINE  1 tablet Per Tube TID  . mouth rinse  15 mL Mouth Rinse BID  . predniSONE  10 mg Per Tube Q breakfast  . sennosides  10 mL Per Tube BID   Continuous Infusions: . sodium chloride    . ceFEPime (MAXIPIME) IV 1 g (11/23/18 1819)  . clevidipine 1 mg/hr (11/24/18 0800)  . dexmedetomidine (PRECEDEX) IV infusion 0.5 mcg/kg/hr (11/24/18 0800)   PRN Meds:.acetaminophen, albuterol, haloperidol lactate, heparin, hydrALAZINE, lidocaine (PF), lidocaine-prilocaine, ondansetron (ZOFRAN) IV, pentafluoroprop-tetrafluoroeth   Pearson Grippe  MD 11/24/2018, 8:46 AM

## 2018-11-24 NOTE — Progress Notes (Signed)
NAME:  Molly Roberts, MRN:  [id number], DOB:  04-Aug-1950, LOS: 2 ADMISSION DATE:  11/22/2018, CONSULTATION DATE:  11/22/18  REFERRING MD:  EDP Dr. Leonides Schanz , CHIEF COMPLAINT:  Respiratory Failure   Brief History   68 yo female former smoker with known ESRD on hemodialysis (T/TH/Sat) with respiratory distress found to be severely hypoxic with O2 sats 58% requiring intubation . Patient missed 1 dialysis session . PCCM consulted for admission   History of present illness   68 year old female with history of end-stage renal disease on dialysis Tuesday Thursday and Saturday.  She has medical history significant for COPD sarcoid and dementia.  Patient's family called EMS as patient was having some respiratory distress.  On arrival EMS noted O2 saturations 58% on room air.  She was placed on a nonrebreather with improvement into the 80s and then on place on CPAP however patient was not not following commands or talking.  Blood sugars were in the 300s.  Report says the patient was still last dialyzed Wednesday, July 15 prior to going out of town on Thursday, July 16.  She missed her dialysis session on Saturday, July 18. ER work-up showed chest x-ray COPD changes and chronic interstitial lung disease.  Increased markings with edema versus pneumonia in the left perihilar area.  SARS-CoV-2 was negative.  BNP was elevated at 974.  Lactic acid 1.9.  Patient was started on empiric antibiotics with Maxipime and vancomycin.  She is currently on sedation with propofol.   Past Medical History  COPD , Sarcoid , Dementia , ESRD   Significant Hospital Events   7/19 admitted, received iHD 7/20 changing sedation and anti HTN medications, hoping to wake up and wean vent   Consults:  Nephrology   Procedures:  7/19 ETT>>7/20  Significant Diagnostic Tests:  CXR 7/19 > ETT present. Hyperinflated lungs, coarse lung markings bilaterally.  Bilateral pleural effusions.  CXR 7/20> ETT present, hyperinflated lungs, bilateral  pleural effusions, bilateral coarse lung markings with some interval improvement to bilateral ASD   Micro Data:  7/19 BC x 2  7/19 Tracheal asp > rare WBC, rare GPC in clusters  7/19 SARS CoV2> negative   Antimicrobials:  7/19 Vanc>>7/19 Maxipime >>  Interim history/subjective:  Extubated.  Downward gaze noted. Objective   Blood pressure 128/71, pulse 79, temperature 99.1 F (37.3 C), temperature source Axillary, resp. rate 20, height 5\' 8"  (1.727 m), weight 47.3 kg, SpO2 99 %.    Vent Mode: PRVC FiO2 (%):  [40 %] 40 % Set Rate:  [22 bmp] 22 bmp Vt Set:  [510 mL] 510 mL PEEP:  [5 cmH20] 5 cmH20 Plateau Pressure:  [22 cmH20] 22 cmH20   Intake/Output Summary (Last 24 hours) at 11/24/2018 0802 Last data filed at 11/24/2018 0600 Gross per 24 hour  Intake 342.69 ml  Output 2001 ml  Net -1658.31 ml   Filed Weights   11/23/18 0500 11/23/18 1600 11/23/18 1900  Weight: 48.8 kg 49.4 kg 47.3 kg    Examination: General: Frail cachectic female currently on Precedex drip is poorly responsive HEENT: Left EJ IV is noted.  No JVD or lymphadenopathy is appreciated Neuro: Poorly responsive.  Noted to have a downward gaze preference.  Does not follow commands. CV: Irregular regular rate and rhythm PULM: even/non-labored, lungs bilaterally diminished in bases DE:YCXK, non-tender, bsx4 active  Extremities: warm/dry, negative edema left forearm with AV graft positive bruit and thrill Skin: no rashes or lesions    Resolved Hospital Problem list  Hyperkalemia   Assessment & Plan:   Acute on chronic respiratory failure with hypoxia requiring intubation -COPD, sarcoid (home prednisone 5-10mg ) -Pulm edema in setting of missed HD -Bilateral pleural effusions R>L  -possible L sided PNA in immunocomp host  -COVID-19 neg -Tracheal aspirate 7/19 with rare WBC, rare GPC in clusters  Plan  Extubated 11/23/2010 Wean Precedex Pulmonary toilet Prednisone.  NG tube for sarcoid  Bronchodilators Serial chest x-ray   Underlying dementia Noted to be on Klonopin, Aricept Resume dementia medications near future   ESRD -presents after missing HD -OP T/R/Sa HD Hyperkalemia, resolved  Plan  Nephrology involved Continue to manage hemodialysis Monitor electrolytes and renal function Daily labs  HTN -home hydral 10mg  TID, coreg 12.5mg  BID, bidil 10-37.5 TID, labetalol 200mg  BID  Elevated Troponin -likely demand ischemia  Plan  Wean Cleviprex drip off as tolerated Place NG tube start Coreg and BiDil Continue to monitor  Anemia -chronic disease, ESRD Recent Labs    11/23/18 1631 11/24/18 0251  HGB 10.2* 9.5*    P Transfuse per protocol  Constipation, at risk  P Propofol is been turned off Added senna  Best practice:  Diet: 11/24/2018 NG tube will be placed if fails will be started and p.o. meds will be given via NG tube Pain/Anxiety/Delirium protocol (if indicated): dc prop starting precedex  VAP protocol (if indicated): in place  DVT prophylaxis: hep GI prophylaxis: pepcid Glucose control: ssi Mobility: br Code Status: full Family Communication: pending 11/24/2018 attempted to call [name] daughter at #[phone] with a full voicemail unable to leave a message at this time. Disposition: icu  Labs   CBC: Recent Labs  Lab 11/22/18 0547 11/22/18 0552 11/23/18 0327 11/23/18 1631 11/24/18 0251  WBC 16.0*  --  15.4* 15.9* 17.2*  NEUTROABS 14.5*  --   --   --   --   HGB 10.4* 10.9* 11.1* 10.2* 9.5*  HCT 34.9* 32.0* 34.1* 30.8* 29.6*  MCV 96.4  --  89.0 90.1 91.6  PLT 188  --  217 193 122*    Basic Metabolic Panel: Recent Labs  Lab 11/22/18 0547 11/22/18 0552 11/22/18 1445 11/23/18 0758 11/24/18 0251  NA 141 138 135 131* 136  K 7.4* 6.7* 3.6 3.7 4.6  CL 101  --  95* 93* 98  CO2 24  --  23 22 26   GLUCOSE 244*  --  106* 91 78  BUN 80*  --  22 43* 22  CREATININE 12.81*  --  4.50* 7.34* 5.03*  CALCIUM 8.9  --  10.8* 9.0  8.4*  PHOS  --   --  2.5  --   --    GFR: Estimated Creatinine Clearance: 7.8 mL/min (A) (by C-G formula based on SCr of 5.03 mg/dL (H)). Recent Labs  Lab 11/22/18 0547 11/22/18 0604 11/23/18 0327 11/23/18 1631 11/24/18 0251  WBC 16.0*  --  15.4* 15.9* 17.2*  LATICACIDVEN  --  1.9  --   --   --     Liver Function Tests: Recent Labs  Lab 11/22/18 0547 11/22/18 1445  AST 72*  --   ALT 45*  --   ALKPHOS 121  --   BILITOT 0.7  --   PROT 6.5  --   ALBUMIN 3.6 3.6   No results for input(s): LIPASE, AMYLASE in the last 168 hours. No results for input(s): AMMONIA in the last 168 hours.  ABG    Component Value Date/Time   PHART 7.270 (L) 11/22/2018 8299  PCO2ART 57.6 (H) 11/22/2018 0552   PO2ART 476.0 (H) 11/22/2018 0552   HCO3 27.3 11/22/2018 0552   TCO2 29 11/22/2018 0552   ACIDBASEDEF 1.0 11/22/2018 0552   O2SAT 100.0 11/22/2018 0552     Coagulation Profile: No results for input(s): INR, PROTIME in the last 168 hours.  Cardiac Enzymes: No results for input(s): CKTOTAL, CKMB, CKMBINDEX, TROPONINI in the last 168 hours.  HbA1C: No results found for: HGBA1C  CBG: Recent Labs  Lab 11/23/18 1503 11/23/18 1934 11/23/18 2338 11/24/18 0315 11/24/18 0759  GLUCAP 107* 98 86 73 76    Critical care time: 30 minutes       Richardson Landry Aneya Daddona ACNP Maryanna Shape PCCM Pager [phone] till 1 pm If no answer page [phone] 11/24/2018, 8:02 AM

## 2018-11-24 NOTE — Progress Notes (Signed)
Initial Nutrition Assessment  DOCUMENTATION CODES:   Underweight  INTERVENTION:  -Initiate; Vital 1.5 @ 37mL/hr  TF @ goal rate provides; 1620 kcal, 73 grams protein, and 83mL of water  B-complex with C; transition to Rena-vit when diet advanced to po  - Monitor magnesium, potassium, and phosphorus daily for at least 3 days, MD to replete as needed, as pt is at risk for refeeding syndrome given underwt, suspected malnutritoin  NUTRITION DIAGNOSIS:   Inadequate oral intake related to inability to eat as evidenced by NPO status.   GOAL:   Patient will meet greater than or equal to 90% of their needs   MONITOR:   TF tolerance, Weight trends, Labs, I & O's, Diet advancement  REASON FOR ASSESSMENT:   Consult Assessment of nutrition requirement/status  ASSESSMENT:  68 year old female with history of ESRD on HD, sarcoidosis/COPD dementia, brought in by EMS with respiratory distress and found to be severely hypoxic with O2 stats 58% requiring intubation   Per chart review, pt last dailyzed Wed. July 15th prior to going out of town on [place]. July 16 and missed session on Sat. July 18   Pt increasingly agitated/restless and confused; not following demands or speaking. For safety of patient;  in bilateral restraints and waist restraint attempting to get out of bed an unable to be verbally redirected.   Patient with continued restlessness and attempting to get out of bed while restrained. RD did not enter room at this time due to patient current status. Patient underweight; suspect moderate/severe malnutrition.   Spoke with RN who confirms NG in place  HD - TTS -  EDW: unavailable at this time 7/21 - Post HD Wt - 45kg  7/23 - Tentative next HD   7/19 - Intubated; iHD 7/20 -Extubated  NG; rt nare; placed today; initiate TF per MD note   Intake/Output Summary (Last 24 hours) at 11/24/2018 1457 Last data filed at 11/24/2018 1400 Gross per 24 hour  Intake 469.17 ml  Output  4101 ml  Net -3631.83 ml    Medications reviewed and include: Coreg, Klonopin, Pepcid, SSI, prednisone, Senokot, Cleviprex, Precedex, Haldol  Labs: K 4.6 Glucose 78 7/19 Phos 2.5 (low for HD)  NUTRITION - FOCUSED PHYSICAL EXAM: Unable to complete at this time - pt highly agitated; restraints in place for safety of patient   Diet Order:   Diet Order            Diet NPO time specified  Diet effective now              EDUCATION NEEDS:   Not appropriate for education at this time  Skin:  Skin Assessment: Reviewed RN Assessment  Last BM:  PTA  Height:   Ht Readings from Last 1 Encounters:  11/22/18 5\' 8"  (1.727 m)    Weight:   Wt Readings from Last 1 Encounters:  11/24/18 45 kg    Ideal Body Weight:  63.6 kg  BMI:  Body mass index is 15.08 kg/m.  Estimated Nutritional Needs:   Kcal:  1500-1700  Protein:  60-68 grams  Fluid:  UOP + 1000 mL   Lajuan Lines, RD, LDN  After Hours/Weekend Pager: [phone]

## 2018-11-25 ENCOUNTER — Inpatient Hospital Stay (HOSPITAL_COMMUNITY): Payer: Medicare Other

## 2018-11-25 LAB — CBC WITH DIFFERENTIAL/PLATELET
Abs Immature Granulocytes: 0.11 K/uL — ABNORMAL HIGH (ref 0.00–0.07)
Basophils Absolute: 0 K/uL (ref 0.0–0.1)
Basophils Relative: 0 %
Eosinophils Absolute: 0.2 K/uL (ref 0.0–0.5)
Eosinophils Relative: 1 %
HCT: 32.6 % — ABNORMAL LOW (ref 36.0–46.0)
Hemoglobin: 10.1 g/dL — ABNORMAL LOW (ref 12.0–15.0)
Immature Granulocytes: 1 %
Lymphocytes Relative: 5 %
Lymphs Abs: 0.9 K/uL (ref 0.7–4.0)
MCH: 28.9 pg (ref 26.0–34.0)
MCHC: 31 g/dL (ref 30.0–36.0)
MCV: 93.1 fL (ref 80.0–100.0)
Monocytes Absolute: 1.6 K/uL — ABNORMAL HIGH (ref 0.1–1.0)
Monocytes Relative: 9 %
Neutro Abs: 15.3 K/uL — ABNORMAL HIGH (ref 1.7–7.7)
Neutrophils Relative %: 84 %
Platelets: 179 K/uL (ref 150–400)
RBC: 3.5 MIL/uL — ABNORMAL LOW (ref 3.87–5.11)
RDW: 17.2 % — ABNORMAL HIGH (ref 11.5–15.5)
WBC: 18.1 K/uL — ABNORMAL HIGH (ref 4.0–10.5)
nRBC: 0 % (ref 0.0–0.2)

## 2018-11-25 LAB — BASIC METABOLIC PANEL WITH GFR
Anion gap: 14 (ref 5–15)
BUN: 37 mg/dL — ABNORMAL HIGH (ref 8–23)
CO2: 25 mmol/L (ref 22–32)
Calcium: 8.7 mg/dL — ABNORMAL LOW (ref 8.9–10.3)
Chloride: 96 mmol/L — ABNORMAL LOW (ref 98–111)
Creatinine, Ser: 4.46 mg/dL — ABNORMAL HIGH (ref 0.44–1.00)
GFR calc Af Amer: 11 mL/min — ABNORMAL LOW (ref >60)
GFR calc non Af Amer: 9 mL/min — ABNORMAL LOW (ref >60)
Glucose, Bld: 134 mg/dL — ABNORMAL HIGH (ref 70–99)
Potassium: 4.4 mmol/L (ref 3.5–5.1)
Sodium: 135 mmol/L (ref 135–145)

## 2018-11-25 LAB — MAGNESIUM
Magnesium: 2.2 mg/dL (ref 1.7–2.4)
Magnesium: 2.4 mg/dL (ref 1.7–2.4)

## 2018-11-25 LAB — GLUCOSE, CAPILLARY
Glucose-Capillary: 121 mg/dL — ABNORMAL HIGH (ref 70–99)
Glucose-Capillary: 123 mg/dL — ABNORMAL HIGH (ref 70–99)
Glucose-Capillary: 134 mg/dL — ABNORMAL HIGH (ref 70–99)
Glucose-Capillary: 142 mg/dL — ABNORMAL HIGH (ref 70–99)
Glucose-Capillary: 165 mg/dL — ABNORMAL HIGH (ref 70–99)
Glucose-Capillary: 172 mg/dL — ABNORMAL HIGH (ref 70–99)
Glucose-Capillary: 174 mg/dL — ABNORMAL HIGH (ref 70–99)

## 2018-11-25 LAB — PHOSPHORUS
Phosphorus: 5.7 mg/dL — ABNORMAL HIGH (ref 2.5–4.6)
Phosphorus: 5.8 mg/dL — ABNORMAL HIGH (ref 2.5–4.6)

## 2018-11-25 MED ORDER — CLONAZEPAM 0.25 MG PO TBDP
0.2500 mg | ORAL_TABLET | Freq: Two times a day (BID) | ORAL | Status: DC | PRN
  Administered 2018-11-27: 0.25 mg via ORAL
  Filled 2018-11-25: qty 1

## 2018-11-25 MED ORDER — ACETAMINOPHEN 160 MG/5ML PO SOLN
650.0000 mg | ORAL | Status: DC | PRN
  Filled 2018-11-25: qty 20.3

## 2018-11-25 MED ORDER — LOSARTAN POTASSIUM 50 MG PO TABS
50.0000 mg | ORAL_TABLET | Freq: Every day | ORAL | Status: DC
  Administered 2018-11-25 – 2018-11-29 (×5): 50 mg via ORAL
  Filled 2018-11-25 (×5): qty 1

## 2018-11-25 MED ORDER — SENNOSIDES 8.8 MG/5ML PO SYRP
10.0000 mL | ORAL_SOLUTION | Freq: Two times a day (BID) | ORAL | Status: DC
  Administered 2018-11-25 – 2018-11-26 (×2): 10 mL
  Filled 2018-11-25 (×4): qty 10

## 2018-11-25 MED ORDER — ACETAMINOPHEN 160 MG/5ML PO SOLN
650.0000 mg | ORAL | Status: DC | PRN
  Administered 2018-11-25 – 2018-11-26 (×2): 650 mg
  Filled 2018-11-25: qty 20.3

## 2018-11-25 MED ORDER — CLONAZEPAM 0.5 MG PO TABS: 0.2500 mg | ORAL_TABLET | Freq: Two times a day (BID) | ORAL | Status: DC | PRN

## 2018-11-25 MED ORDER — SODIUM CHLORIDE 0.9 % IV SOLN
1.0000 g | INTRAVENOUS | Status: AC
  Administered 2018-11-25 – 2018-11-28 (×4): 1 g via INTRAVENOUS
  Filled 2018-11-25: qty 10
  Filled 2018-11-25: qty 1
  Filled 2018-11-25: qty 10
  Filled 2018-11-25: qty 1

## 2018-11-25 NOTE — Care Management (Signed)
CM attempted to contact pts daughter per request relayed by bedside nurse - unfortunately CM was unable to reach pt and also unable to leave a VM.  CM requested bedside nurse to provide daughter with CM mobile work phone number when she calls again for an update.

## 2018-11-25 NOTE — Progress Notes (Signed)
NAME:  Molly Roberts, MRN:  [id number], DOB:  1950-11-26, LOS: 3 ADMISSION DATE:  11/22/2018, CONSULTATION DATE:  11/22/18  REFERRING MD:  EDP Dr. Leonides Schanz , CHIEF COMPLAINT:  Respiratory Failure   Brief History   68 yo female former smoker with known ESRD on hemodialysis (T/TH/Sat) with respiratory distress found to be severely hypoxic with O2 sats 58% requiring intubation . Patient missed 1 dialysis session . PCCM consulted for admission   History of present illness   68 year old female with history of end-stage renal disease on dialysis Tuesday Thursday and Saturday.  She has medical history significant for COPD sarcoid and dementia.  Patient's family called EMS as patient was having some respiratory distress.  On arrival EMS noted O2 saturations 58% on room air.  She was placed on a nonrebreather with improvement into the 80s and then on place on CPAP however patient was not not following commands or talking.  Blood sugars were in the 300s.  Report says the patient was still last dialyzed Wednesday, July 15 prior to going out of town on Thursday, July 16.  She missed her dialysis session on Saturday, July 18. ER work-up showed chest x-ray COPD changes and chronic interstitial lung disease.  Increased markings with edema versus pneumonia in the left perihilar area.  SARS-CoV-2 was negative.  BNP was elevated at 974.  Lactic acid 1.9.  Patient was started on empiric antibiotics with Maxipime and vancomycin.  She is currently on sedation with propofol.   Past Medical History  COPD , Sarcoid , Dementia , ESRD   Significant Hospital Events   7/19 admitted, received iHD 7/20 changing sedation and anti HTN medications, hoping to wake up and wean vent   Consults:  Nephrology   Procedures:  7/19 ETT>>7/20  Significant Diagnostic Tests:  CXR 7/19 > ETT present. Hyperinflated lungs, coarse lung markings bilaterally.  Bilateral pleural effusions.  CXR 7/20> ETT present, hyperinflated lungs, bilateral  pleural effusions, bilateral coarse lung markings with some interval improvement to bilateral ASD   Micro Data:  7/19 BC x 2  7/19 Tracheal asp > rare WBC, rare GPC in clusters  7/19 SARS CoV2> negative   Antimicrobials:  7/19 Vanc>>7/19 Maxipime >>  Interim history/subjective:  More awake, more interactive. Objective   Blood pressure (!) 103/54, pulse (!) 101, temperature 99.2 F (37.3 C), temperature source Axillary, resp. rate (!) 21, height 5\' 8"  (1.727 m), weight 45 kg, SpO2 100 %.        Intake/Output Summary (Last 24 hours) at 11/25/2018 9449 Last data filed at 11/25/2018 6759 Gross per 24 hour  Intake 1174.25 ml  Output 2100 ml  Net -925.75 ml   Filed Weights   11/23/18 1900 11/24/18 0827 11/24/18 1207  Weight: 47.3 kg 47.3 kg 45 kg    Examination: General: Frail cachectic female who is more alert HEENT: No JVD or lymphadenopathy is appreciated.  EJ IV is noted on the left Neuro: More interactive but still confused. CV: s1s2 rrr, no m/r/g PULM: even/non-labored, lungs bilaterally diminished in the bases FM:BWGY, non-tender, bsx4 active  Extremities: warm/dry, negative edema, left forearm AV graft positive bruit Skin: no rashes or lesions     Resolved Hospital Problem list    Hyperkalemia   Assessment & Plan:   Acute on chronic respiratory failure with hypoxia requiring intubation -COPD, sarcoid (home prednisone 5-10mg ) -Pulm edema in setting of missed HD -Bilateral pleural effusions R>L  -possible L sided PNA in immunocomp host  -COVID-19 neg -Tracheal aspirate  7/19 with rare WBC, rare GPC in clusters  Plan  Extubated 11/23/2018 Off Precedex and propofol Continue pulmonary toilet Continue prednisone for sarcoid via NG tube Bronchodilators Serial chest x-ray   Underlying dementia Resume dementia medications in the near future   ESRD -presents after missing HD -OP T/R/Sa HD Hyperkalemia, resolved  Plan  Hemodialysis per nephrology Continue  to monitor labs  HTN -home hydral 10mg  TID, coreg 12.5mg  BID, bidil 10-37.5 TID, labetalol 200mg  BID  Elevated Troponin -likely demand ischemia  Plan  Cleviprex off Continue current antihypertensives with addition of Cozaar home medication restarted on 11/23/2018  Anemia -chronic disease, ESRD Recent Labs    11/24/18 0251 11/25/18 0245  HGB 9.5* 10.1*    P Transfuse per protocol  Constipation, at risk  P Continue senna  Best practice:  Diet: Tube feeding placed Pain/Anxiety/Delirium protocol (if indicated): Precedex has been stopped VAP protocol (if indicated): N/A DVT prophylaxis: hep GI prophylaxis: pepcid Glucose control: ssi Mobility: br Code Status: full Family Communication: pending 11/24/2018 attempted to call [name] daughter at #[phone] with a full voicemail unable to leave a message at this time. Disposition: icu.  11/23/2018 she can transition to [facility] hospitalist service on 11/26/2018  Labs   CBC: Recent Labs  Lab 11/22/18 0547 11/22/18 0552 11/23/18 0327 11/23/18 1631 11/24/18 0251 11/25/18 0245  WBC 16.0*  --  15.4* 15.9* 17.2* 18.1*  NEUTROABS 14.5*  --   --   --   --  15.3*  HGB 10.4* 10.9* 11.1* 10.2* 9.5* 10.1*  HCT 34.9* 32.0* 34.1* 30.8* 29.6* 32.6*  MCV 96.4  --  89.0 90.1 91.6 93.1  PLT 188  --  217 193 122* 694    Basic Metabolic Panel: Recent Labs  Lab 11/22/18 0547 11/22/18 0552 11/22/18 1445 11/23/18 0758 11/24/18 0251 11/24/18 1509 11/25/18 0245  NA 141 138 135 131* 136  --  135  K 7.4* 6.7* 3.6 3.7 4.6  --  4.4  CL 101  --  95* 93* 98  --  96*  CO2 24  --  23 22 26   --  25  GLUCOSE 244*  --  106* 91 78  --  134*  BUN 80*  --  22 43* 22  --  37*  CREATININE 12.81*  --  4.50* 7.34* 5.03*  --  4.46*  CALCIUM 8.9  --  10.8* 9.0 8.4*  --  8.7*  MG  --   --   --   --   --  2.0 2.2  PHOS  --   --  2.5  --   --  4.6 5.7*   GFR: Estimated Creatinine Clearance: 8.3 mL/min (A) (by C-G formula based on SCr of 4.46 mg/dL  (H)). Recent Labs  Lab 11/22/18 0604 11/23/18 0327 11/23/18 1631 11/24/18 0251 11/25/18 0245  WBC  --  15.4* 15.9* 17.2* 18.1*  LATICACIDVEN 1.9  --   --   --   --     Liver Function Tests: Recent Labs  Lab 11/22/18 0547 11/22/18 1445  AST 72*  --   ALT 45*  --   ALKPHOS 121  --   BILITOT 0.7  --   PROT 6.5  --   ALBUMIN 3.6 3.6   No results for input(s): LIPASE, AMYLASE in the last 168 hours. No results for input(s): AMMONIA in the last 168 hours.  ABG    Component Value Date/Time   PHART 7.270 (L) 11/22/2018 0552   PCO2ART 57.6 (H)  11/22/2018 0552   PO2ART 476.0 (H) 11/22/2018 0552   HCO3 27.3 11/22/2018 0552   TCO2 29 11/22/2018 0552   ACIDBASEDEF 1.0 11/22/2018 0552   O2SAT 100.0 11/22/2018 0552     Coagulation Profile: No results for input(s): INR, PROTIME in the last 168 hours.  Cardiac Enzymes: No results for input(s): CKTOTAL, CKMB, CKMBINDEX, TROPONINI in the last 168 hours.  HbA1C: No results found for: HGBA1C  CBG: Recent Labs  Lab 11/24/18 1557 11/24/18 2005 11/25/18 0006 11/25/18 0410 11/25/18 0750  GLUCAP 101* 134* 142* 123* 174*    Critical care time: 30 minutes       Richardson Landry Paulita Licklider ACNP Maryanna Shape PCCM Pager [phone] till 1 pm If no answer page [phone] 11/25/2018, 8:33 AM

## 2018-11-25 NOTE — Progress Notes (Signed)
SLP Cancellation Note  Patient Details Name: Molly Roberts MRN: [id number] DOB: March 06, 1951   Cancelled treatment:       Reason Eval/Treat Not Completed: Patient's level of consciousness   Sharrie Self, Katherene Ponto 11/25/2018, 1:52 PM

## 2018-11-25 NOTE — Progress Notes (Signed)
Admit: 11/22/2018 LOS: 3  69F ESRD THS [place] with missed HD, inc K, acute resp failure  Subjective:  Marland Kitchen More awake, alert, interactive . HD yesterday:  2.1L UF, tol well . Stable labs this AM  07/21 0701 - 07/22 0700 In: 1095 [I.V.:194.7; NG/GT:800.3; IV Piggyback:100] Out: 2100   [facility] Weights   11/23/18 1900 11/24/18 0827 11/24/18 1207  Weight: 47.3 kg 47.3 kg 45 kg    Scheduled Meds: . aspirin  325 mg Oral Daily  . B-complex with vitamin C  1 tablet Per Tube Daily  . budesonide (PULMICORT) nebulizer solution  0.25 mg Nebulization BID  . carvedilol  12.5 mg Per Tube BID WC  . Chlorhexidine Gluconate Cloth  6 each Topical Q0600  . clonazePAM  0.25 mg Oral BID  . famotidine  20 mg Per Tube Daily  . heparin  5,000 Units Subcutaneous Q8H  . insulin aspart  1-3 Units Subcutaneous Q4H  . isosorbide-hydrALAZINE  1 tablet Per Tube TID  . losartan  50 mg Oral Daily  . mouth rinse  15 mL Mouth Rinse BID  . predniSONE  10 mg Per Tube Q breakfast  . sennosides  10 mL Per Tube BID   Continuous Infusions: . sodium chloride 10 mL/hr at 11/25/18 0800  . ceFEPime (MAXIPIME) IV 1 g (11/24/18 1714)  . clevidipine Stopped (11/24/18 0801)  . dexmedetomidine (PRECEDEX) IV infusion Stopped (11/24/18 0802)  . feeding supplement (VITAL 1.5 CAL) 1,000 mL (11/24/18 1633)   PRN Meds:.acetaminophen, albuterol, haloperidol lactate, heparin, hydrALAZINE, lidocaine (PF), lidocaine-prilocaine, ondansetron (ZOFRAN) IV, pentafluoroprop-tetrafluoroeth  Current Labs: reviewed    Physical Exam:  Blood pressure 111/72, pulse 93, temperature 99.2 F (37.3 C), temperature source Axillary, resp. rate (!) 25, height 5\' 8"  (1.727 m), weight 45 kg, SpO2 100 %. Sleepy, arouses to voice RRR nl s1s2 CTAB No LEE  L FA AV acces +B/T S/nt/nd NCAT EOMI  A 1. ESRD THS; [place], L FA AVG 2. Acute on chronic RF, stable; pulm edema; improving 3. CKD BMD: stable 4. Anemia:  Hb stable 5. HTN/VolBP stable, cont PO meds 6. Dementia 7. COPD 8. Sarcoid 9. Hyperkalemia, K improved  P . Cont HD on schedule: 1-2L UF, 2K, 3h, Tight heparin . Medication Issues; o Preferred narcotic agents for pain control are hydromorphone, fentanyl, and methadone. Morphine should not be used.  o Baclofen should be avoided o Avoid oral sodium phosphate and magnesium citrate based laxatives / bowel preps    Pearson Grippe MD 11/25/2018, 9:16 AM  Recent Labs  Lab 11/22/18 1445 11/23/18 0758 11/24/18 0251 11/24/18 1509 11/25/18 0245  NA 135 131* 136  --  135  K 3.6 3.7 4.6  --  4.4  CL 95* 93* 98  --  96*  CO2 23 22 26   --  25  GLUCOSE 106* 91 78  --  134*  BUN 22 43* 22  --  37*  CREATININE 4.50* 7.34* 5.03*  --  4.46*  CALCIUM 10.8* 9.0 8.4*  --  8.7*  PHOS 2.5  --   --  4.6 5.7*   Recent Labs  Lab 11/22/18 0547  11/23/18 1631 11/24/18 0251 11/25/18 0245  WBC 16.0*   < > 15.9* 17.2* 18.1*  NEUTROABS 14.5*  --   --   --  15.3*  HGB 10.4*   < > 10.2* 9.5* 10.1*  HCT 34.9*   < > 30.8* 29.6* 32.6*  MCV 96.4   < > 90.1 91.6  93.1  PLT 188   < > 193 122* 179   < > = values in this interval not displayed.

## 2018-11-26 ENCOUNTER — Inpatient Hospital Stay (HOSPITAL_COMMUNITY): Payer: Medicare Other

## 2018-11-26 DIAGNOSIS — J441 Chronic obstructive pulmonary disease with (acute) exacerbation: Secondary | ICD-10-CM

## 2018-11-26 DIAGNOSIS — J181 Lobar pneumonia, unspecified organism: Secondary | ICD-10-CM

## 2018-11-26 DIAGNOSIS — I10 Essential (primary) hypertension: Secondary | ICD-10-CM

## 2018-11-26 DIAGNOSIS — R410 Disorientation, unspecified: Secondary | ICD-10-CM

## 2018-11-26 LAB — CBC WITH DIFFERENTIAL/PLATELET
Abs Immature Granulocytes: 0.1 10*3/uL — ABNORMAL HIGH (ref 0.00–0.07)
Basophils Absolute: 0 10*3/uL (ref 0.0–0.1)
Basophils Relative: 0 %
Eosinophils Absolute: 0.3 10*3/uL (ref 0.0–0.5)
Eosinophils Relative: 1 %
HCT: 36 % (ref 36.0–46.0)
Hemoglobin: 11.2 g/dL — ABNORMAL LOW (ref 12.0–15.0)
Immature Granulocytes: 1 %
Lymphocytes Relative: 6 %
Lymphs Abs: 1.2 10*3/uL (ref 0.7–4.0)
MCH: 29.1 pg (ref 26.0–34.0)
MCHC: 31.1 g/dL (ref 30.0–36.0)
MCV: 93.5 fL (ref 80.0–100.0)
Monocytes Absolute: 1.4 10*3/uL — ABNORMAL HIGH (ref 0.1–1.0)
Monocytes Relative: 7 %
Neutro Abs: 17.2 10*3/uL — ABNORMAL HIGH (ref 1.7–7.7)
Neutrophils Relative %: 85 %
Platelets: 201 10*3/uL (ref 150–400)
RBC: 3.85 MIL/uL — ABNORMAL LOW (ref 3.87–5.11)
RDW: 17 % — ABNORMAL HIGH (ref 11.5–15.5)
WBC: 20.1 10*3/uL — ABNORMAL HIGH (ref 4.0–10.5)
nRBC: 0 % (ref 0.0–0.2)

## 2018-11-26 LAB — GLUCOSE, CAPILLARY
Glucose-Capillary: 152 mg/dL — ABNORMAL HIGH (ref 70–99)
Glucose-Capillary: 157 mg/dL — ABNORMAL HIGH (ref 70–99)
Glucose-Capillary: 155 mg/dL — ABNORMAL HIGH (ref 70–99)
Glucose-Capillary: 275 mg/dL — ABNORMAL HIGH (ref 70–99)
Glucose-Capillary: 115 mg/dL — ABNORMAL HIGH (ref 70–99)
Glucose-Capillary: 30 mg/dL — CL (ref 70–99)

## 2018-11-26 LAB — BASIC METABOLIC PANEL
Anion gap: 19 — ABNORMAL HIGH (ref 5–15)
BUN: 64 mg/dL — ABNORMAL HIGH (ref 8–23)
CO2: 23 mmol/L (ref 22–32)
Calcium: 8.8 mg/dL — ABNORMAL LOW (ref 8.9–10.3)
Chloride: 94 mmol/L — ABNORMAL LOW (ref 98–111)
Creatinine, Ser: 7.3 mg/dL — ABNORMAL HIGH (ref 0.44–1.00)
GFR calc Af Amer: 6 mL/min — ABNORMAL LOW (ref >60)
GFR calc non Af Amer: 5 mL/min — ABNORMAL LOW (ref >60)
Glucose, Bld: 149 mg/dL — ABNORMAL HIGH (ref 70–99)
Potassium: 4.6 mmol/L (ref 3.5–5.1)
Sodium: 136 mmol/L (ref 135–145)

## 2018-11-26 LAB — PHOSPHORUS: Phosphorus: 5.1 mg/dL — ABNORMAL HIGH (ref 2.5–4.6)

## 2018-11-26 LAB — MAGNESIUM: Magnesium: 2.4 mg/dL (ref 1.7–2.4)

## 2018-11-26 MED ORDER — ISOSORB DINITRATE-HYDRALAZINE 20-37.5 MG PO TABS
1.0000 | ORAL_TABLET | Freq: Three times a day (TID) | ORAL | Status: DC
  Administered 2018-11-26 – 2018-11-29 (×8): 1 via ORAL
  Filled 2018-11-26 (×8): qty 1

## 2018-11-26 MED ORDER — DEXTROSE 50 % IV SOLN
25.0000 g | INTRAVENOUS | Status: AC
  Administered 2018-11-26: 25 g via INTRAVENOUS
  Filled 2018-11-26: qty 50

## 2018-11-26 MED ORDER — B COMPLEX-C PO TABS
1.0000 | ORAL_TABLET | Freq: Every day | ORAL | Status: DC
  Administered 2018-11-27 – 2018-11-29 (×3): 1 via ORAL
  Filled 2018-11-26 (×3): qty 1

## 2018-11-26 MED ORDER — PREDNISONE 10 MG PO TABS
10.0000 mg | ORAL_TABLET | Freq: Every day | ORAL | Status: DC
  Administered 2018-11-27 – 2018-11-29 (×3): 10 mg via ORAL
  Filled 2018-11-26 (×3): qty 1

## 2018-11-26 MED ORDER — ACETAMINOPHEN 160 MG/5ML PO SOLN
650.0000 mg | ORAL | Status: DC | PRN
  Administered 2018-11-28: 650 mg via ORAL
  Filled 2018-11-26: qty 20.3

## 2018-11-26 MED ORDER — FAMOTIDINE 40 MG/5ML PO SUSR
20.0000 mg | Freq: Every day | ORAL | Status: DC
  Administered 2018-11-27 – 2018-11-29 (×3): 20 mg via ORAL
  Filled 2018-11-26 (×5): qty 2.5

## 2018-11-26 MED ORDER — CARVEDILOL 12.5 MG PO TABS
12.5000 mg | ORAL_TABLET | Freq: Two times a day (BID) | ORAL | Status: DC
  Administered 2018-11-26 – 2018-11-29 (×5): 12.5 mg via ORAL
  Filled 2018-11-26 (×5): qty 1

## 2018-11-26 MED ORDER — SENNA 8.6 MG PO TABS
1.0000 | ORAL_TABLET | Freq: Two times a day (BID) | ORAL | Status: DC
  Administered 2018-11-27 – 2018-11-29 (×4): 8.6 mg via ORAL
  Filled 2018-11-26 (×4): qty 1

## 2018-11-26 NOTE — Care Management Important Message (Signed)
Important Message  Patient Details  Name: Molly Roberts MRN: [id number] Date of Birth: 08/01/1950   Medicare Important Message Given:  Yes     Kathey Simer 11/26/2018, 1:56 PM

## 2018-11-26 NOTE — Evaluation (Signed)
Occupational Therapy Evaluation Patient Details Name: Molly Roberts MRN: [id number] DOB: 1950/11/14 Today's Date: 11/26/2018    History of Present Illness Pt adm with acute respiratory distress and required intubation 7/19-7/20. Pt had been visiting from out of town and missed HD session prior. PMH - ESRD on HD, copd, dementia, sarcoid.    Clinical Impression   Patient presenting with decreased I in self care, cognition, balance, functional mobility/transfers, endurance, and safety awareness. Patient is from out of town and was visiting family for birthday. Unsure of home situation or if family can provide assist at discharge. Pt with periods of lethargy and restlessness during this evaluation.  Patient currently functioning at min - max A. Patient will benefit from acute OT to increase overall independence in the areas of ADLs, functional mobility, and safety awareness in order to safely discharge to next venue of care.    Follow Up Recommendations  SNF;Supervision/Assistance - 24 hour    Equipment Recommendations  Other (comment)(defer to next venue of care)    Recommendations for Other Services Other (comment)(none at this time)     Precautions / Restrictions Precautions Precautions: Fall Restrictions Weight Bearing Restrictions: No      Mobility Bed Mobility Overal bed mobility: Needs Assistance Bed Mobility: Supine to Sit;Sit to Supine     Supine to sit: Min assist Sit to supine: Min assist   General bed mobility comments: impulsive with movement  Transfers Overall transfer level: Needs assistance Equipment used: 1 person hand held assist Transfers: Sit to/from Stand Sit to Stand: Mod assist         General transfer comment: mod lifting assistance to power up    Balance Overall balance assessment: Needs assistance Sitting-balance support: Bilateral upper extremity supported;Feet supported Sitting balance-Leahy Scale: Poor Sitting balance - Comments: Sat EOB  with UE support and min guard   Standing balance support: Single extremity supported Standing balance-Leahy Scale: Poor Standing balance comment: min assist for static standing              ADL either performed or assessed with clinical judgement   ADL Overall ADL's : Needs assistance/impaired     Grooming: Wash/dry hands;Wash/dry face;Sitting;Cueing for safety;Cueing for sequencing   Upper Body Bathing: Moderate assistance;Sitting;Cueing for sequencing           Lower Body Dressing: Min guard;Cueing for safety      General ADL Comments: min - mod multimodal cuing for sequencing and initiation     Vision   Additional Comments: unable to formally assess            Pertinent Vitals/Pain Pain Assessment: Faces Faces Pain Scale: Hurts little more Pain Location: generalized Pain Descriptors / Indicators: Discomfort Pain Intervention(s): Monitored during session;Repositioned     Hand Dominance     Extremity/Trunk Assessment Upper Extremity Assessment Upper Extremity Assessment: Generalized weakness   Lower Extremity Assessment Lower Extremity Assessment: Generalized weakness   Cervical / Trunk Assessment Cervical / Trunk Assessment: Kyphotic   Communication Communication Communication: Other (comment)(mumbling. She did respond with "yes" and " no" on occasion)   Cognition Arousal/Alertness: Awake/alert Behavior During Therapy: Restless Overall Cognitive Status: No family/caregiver present to determine baseline cognitive functioning Area of Impairment: Orientation;Attention;Memory;Following commands;Safety/judgement;Problem solving   Orientation Level: Disoriented to;Place;Time;Situation Current Attention Level: Focused Memory: Decreased short-term memory Following Commands: Follows one step commands inconsistently Safety/Judgement: Decreased awareness of safety;Decreased awareness of deficits   Problem Solving: Slow processing;Decreased  initiation;Difficulty sequencing;Requires verbal cues;Requires tactile cues General Comments: Pt with baseline dementia  per chart but unsure of level of impairment   General Comments  Amb on RA with SpO2 92%            Home Living Family/patient expects to be discharged to:: Unsure         Additional Comments: Pt is from out of town and was visiting family to celebrate her birthday and missed HD session. Unsure if family here can provide assist at home.       Prior Functioning/Environment          Comments: Per pt she was ambulatory without assistive device but unsure of accuracy due to cognition.         OT Problem List: Decreased strength;Pain;Decreased cognition;Decreased activity tolerance;Decreased safety awareness;Impaired balance (sitting and/or standing)      OT Treatment/Interventions: Self-care/ADL training;Therapeutic exercise;Therapeutic activities;Cognitive remediation/compensation;Energy conservation;DME and/or AE instruction;Patient/family education;Manual therapy;Balance training    OT Goals(Current goals can be found in the care plan section) Acute Rehab OT Goals Patient Stated Goal: Pt didn't state OT Goal Formulation: Patient unable to participate in goal setting  OT Frequency: Min 2X/week   Barriers to D/C:    none known at this time          AM-PAC OT "6 Clicks" Daily Activity     Outcome Measure Help from another person eating meals?: A Lot Help from another person taking care of personal grooming?: A Little Help from another person toileting, which includes using toliet, bedpan, or urinal?: A Lot Help from another person bathing (including washing, rinsing, drying)?: A Lot Help from another person to put on and taking off regular upper body clothing?: A Little Help from another person to put on and taking off regular lower body clothing?: A Lot 6 Click Score: 14   End of Session Nurse Communication: Mobility status(ID bracelet too  tight)  Activity Tolerance: Patient tolerated treatment well Patient left: in bed;with bed alarm set;with call bell/phone within reach;with restraints reapplied  OT Visit Diagnosis: Muscle weakness (generalized) (M62.81)                Time: 7989-2119 OT Time Calculation (min): 18 min Charges:  OT General Charges $OT Visit: 1 Visit OT Evaluation $OT Eval Moderate Complexity: 1 Mod  Chrystie Hagwood P, MS, OTR/L 11/26/2018, 12:46 PM

## 2018-11-26 NOTE — Progress Notes (Signed)
Renal Navigator received request for update on patient from home OP HD clinic/[facility]. H&P and yesterday's Nephrology and Critical Care progress notes faxed to clinic.  Alphonzo Cruise, [place] Renal Navigator [phone]

## 2018-11-26 NOTE — Progress Notes (Signed)
Hypoglycemic Event  CBG: 30  Treatment: attempted 8oz cup of Sprite, unable to consume entire cup, then given 25g of dextrose IV   Symptoms: diaphoresis, generalized weakness   Follow-up CBG: Time:2021 CBG Result:1930  Possible Reasons for Event: fast acting insulin given at 1640, unsure of amount of food eaten at dinner  Comments/MD notified:n/a    White,Adrienne R

## 2018-11-26 NOTE — Evaluation (Signed)
Clinical/Bedside Swallow Evaluation Patient Details  Name: Molly Roberts MRN: [id number] Date of Birth: 09-06-1950  Today's Date: 11/26/2018 Time: SLP Start Time (ACUTE ONLY): 0093 SLP Stop Time (ACUTE ONLY): 0906 SLP Time Calculation (min) (ACUTE ONLY): 11 min  HPI:  68 yo female former smoker with known ESRD on hemodialysis (T/TH/Sat) with respiratory distress found to be severely hypoxic with O2 sats 58% requiring intubation . Patient missed 1 dialysis session. She has medical history significant for COPD sarcoid and dementia.  Intubated 7/19-7/20.    Assessment / Plan / Recommendation Clinical Impression  Pt demonstrates impaired swallow function likely primarily due to pain from NG tube as well as AMS. She is alert and strong enough to sit herself up in bed. Her speech is clear, but shen she is given a sip she lets it spill anteriorally from her lips due to fear of swallowing. She asks for a throat spray and cries. When she does elicit a swallow with water or puree it is normal in appearance. Suspect intake will normalize when NG is removed as long as arousal stays appropriate. Will initiate a mech soft diet and thin liquids and monitor for tolerance. She may not consume solids yet, but will offer them in case appetite improves.  SLP Visit Diagnosis: Dysphagia, unspecified (R13.10)    Aspiration Risk  Mild aspiration risk    Diet Recommendation Dysphagia 3 (Mech soft);Thin liquid   Liquid Administration via: Cup;Straw Medication Administration: Whole meds with liquid Supervision: Staff to assist with self feeding Compensations: Slow rate;Small sips/bites;Minimize environmental distractions Postural Changes: Seated upright at 90 degrees    Other  Recommendations Oral Care Recommendations: Oral care BID   Follow up Recommendations None      Frequency and Duration min 2x/week  1 week       Prognosis Prognosis for Safe Diet Advancement: Good      Swallow Study   General HPI:  68 yo female former smoker with known ESRD on hemodialysis (T/TH/Sat) with respiratory distress found to be severely hypoxic with O2 sats 58% requiring intubation . Patient missed 1 dialysis session. She has medical history significant for COPD sarcoid and dementia.  Intubated 7/19-7/20.  Type of Study: Bedside Swallow Evaluation Diet Prior to this Study: NPO Temperature Spikes Noted: No Respiratory Status: Room air History of Recent Intubation: No Behavior/Cognition: Confused;Requires cueing Oral Cavity Assessment: Within Functional Limits Oral Care Completed by SLP: No Oral Cavity - Dentition: Adequate natural dentition Vision: Functional for self-feeding Self-Feeding Abilities: Needs assist Patient Positioning: Upright in bed Baseline Vocal Quality: Normal Volitional Cough: Strong Volitional Swallow: Unable to elicit    Oral/Motor/Sensory Function Overall Oral Motor/Sensory Function: Generalized oral weakness   Ice Chips     Thin Liquid Thin Liquid: Impaired Presentation: Cup;Straw;Self Fed Oral Phase Impairments: Reduced labial seal;Reduced lingual movement/coordination Oral Phase Functional Implications: Right anterior spillage;Left anterior spillage;Prolonged oral transit;Oral holding    Nectar Thick Nectar Thick Liquid: Not tested   Honey Thick Honey Thick Liquid: Not tested   Puree Puree: Impaired Presentation: Spoon Oral Phase Impairments: Reduced labial seal;Reduced lingual movement/coordination Oral Phase Functional Implications: Oral holding   Solid     Solid: Not tested     Herbie Baltimore, MA CCC-SLP  Acute Rehabilitation Services Pager [phone] Office [phone]  Lynann Beaver 11/26/2018,9:25 AM

## 2018-11-26 NOTE — Evaluation (Signed)
Physical Therapy Evaluation Patient Details Name: Molly Roberts MRN: [id number] DOB: May 07, 1950 Today's Date: 11/26/2018   History of Present Illness  Pt adm with acute respiratory distress and required intubation 7/19-7/20. Pt had been visiting from out of town and missed HD session prior. PMH - ESRD on HD, copd, dementia, sarcoid.   Clinical Impression  Pt presents to PT with significant cognitive deficits as well as requiring physical assist with all mobility. At this level will need ST-SNF unless family can provide 24 hour physical assist. Hopefully pt will improve rapidly and perhaps family can provide care at home.    Follow Up Recommendations SNF;Supervision/Assistance - 24 hour(unless pt has 24 hour assist or improves rapidly)    Equipment Recommendations  Other (comment)(to be determined)    Recommendations for Other Services       Precautions / Restrictions Precautions Precautions: Fall Restrictions Weight Bearing Restrictions: No      Mobility  Bed Mobility Overal bed mobility: Needs Assistance Bed Mobility: Supine to Sit;Sit to Supine     Supine to sit: Min assist Sit to supine: Min assist   General bed mobility comments: assist to elevate trunk into sitting. Assist to guide trunk and legs on return to supine.   Transfers Overall transfer level: Needs assistance Equipment used: 1 person hand held assist Transfers: Sit to/from Stand Sit to Stand: Mod assist;+2 safety/equipment;Min assist         General transfer comment: Assist to bring hips up and for balance. Stand from bed with min assist and stand from chair with mod assist  Ambulation/Gait Ambulation/Gait assistance: Min assist;Mod assist;+2 safety/equipment Gait Distance (Feet): 60 Feet Assistive device: 1 person hand held assist Gait Pattern/deviations: Step-through pattern;Decreased step length - right;Decreased step length - left;Shuffle(knees beginning to buckle) Gait velocity: decr Gait  velocity interpretation: <1.31 ft/sec, indicative of household ambulator General Gait Details: Assist for balance and support. As pt fatigued her knees would begin to buckle  Stairs            Wheelchair Mobility    Modified Rankin (Stroke Patients Only)       Balance Overall balance assessment: Needs assistance Sitting-balance support: Bilateral upper extremity supported;Feet supported Sitting balance-Leahy Scale: Poor Sitting balance - Comments: Sat EOB with UE support and min guard   Standing balance support: Single extremity supported Standing balance-Leahy Scale: Poor Standing balance comment: min assist for static standing                             Pertinent Vitals/Pain Pain Assessment: Faces Faces Pain Scale: No hurt Pain Location: throat    Home Living Family/patient expects to be discharged to:: Unsure                 Additional Comments: Pt is from out of town and was visiting family to celebrate her birthday and missed HD session. Unsure if family here can provide assist at home.     Prior Function           Comments: Per pt she was ambulatory without assistive device but unsure of accuracy due to cognition.      Hand Dominance        Extremity/Trunk Assessment   Upper Extremity Assessment Upper Extremity Assessment: Defer to OT evaluation    Lower Extremity Assessment Lower Extremity Assessment: Generalized weakness    Cervical / Trunk Assessment Cervical / Trunk Assessment: Kyphotic  Communication   Communication: Other (comment)(Pt  mumbling and difficult to understand)  Cognition Arousal/Alertness: Awake/alert Behavior During Therapy: Restless Overall Cognitive Status: No family/caregiver present to determine baseline cognitive functioning Area of Impairment: Orientation;Attention;Memory;Following commands;Safety/judgement;Problem solving                 Orientation Level: Disoriented  to;Place;Time;Situation Current Attention Level: Focused Memory: Decreased short-term memory Following Commands: Follows one step commands inconsistently Safety/Judgement: Decreased awareness of safety;Decreased awareness of deficits   Problem Solving: Slow processing;Decreased initiation;Difficulty sequencing;Requires verbal cues;Requires tactile cues General Comments: Pt with baseline dementia per chart but unsure of level of impairment      General Comments General comments (skin integrity, edema, etc.): Amb on RA with SpO2 92%    Exercises     Assessment/Plan    PT Assessment Patient needs continued PT services  PT Problem List Decreased strength;Decreased activity tolerance;Decreased balance;Decreased mobility;Decreased cognition;Decreased safety awareness       PT Treatment Interventions DME instruction;Gait training;Functional mobility training;Therapeutic activities;Therapeutic exercise;Balance training;Cognitive remediation;Patient/family education    PT Goals (Current goals can be found in the Care Plan section)  Acute Rehab PT Goals Patient Stated Goal: Pt didn't state PT Goal Formulation: Patient unable to participate in goal setting Time For Goal Achievement: 12/10/18 Potential to Achieve Goals: Good    Frequency Min 3X/week   Barriers to discharge Other (comment) from out of town with unknown family support    Co-evaluation               AM-PAC PT "6 Clicks" Mobility  Outcome Measure Help needed turning from your back to your side while in a flat bed without using bedrails?: A Little Help needed moving from lying on your back to sitting on the side of a flat bed without using bedrails?: A Little Help needed moving to and from a bed to a chair (including a wheelchair)?: A Little Help needed standing up from a chair using your arms (e.g., wheelchair or bedside chair)?: A Lot Help needed to walk in hospital room?: A Lot Help needed climbing 3-5 steps  with a railing? : Total 6 Click Score: 14    End of Session Equipment Utilized During Treatment: Gait belt Activity Tolerance: Patient limited by fatigue Patient left: in bed;with call bell/phone within reach;with bed alarm set;with restraints reapplied Nurse Communication: Mobility status;Other (comment)(left O2 off) PT Visit Diagnosis: Unsteadiness on feet (R26.81);Other abnormalities of gait and mobility (R26.89);Muscle weakness (generalized) (M62.81)    Time: 1610-9604 PT Time Calculation (min) (ACUTE ONLY): 28 min   Charges:   PT Evaluation $PT Eval Moderate Complexity: 1 Mod PT Treatments $Gait Training: 8-22 mins        [place] Pager [phone] [facility] 11/26/2018, 11:09 AM

## 2018-11-26 NOTE — Progress Notes (Addendum)
PROGRESS NOTE                                                                                                                                                                                                             Patient Demographics:    Molly Roberts, is a 68 y.o. female, DOB - February 04, 1951, YTK:[id number]  Admit date - 11/22/2018   Admitting Physician Rigoberto Noel, MD  Outpatient Primary MD for the patient is No primary care provider on file.  LOS - 4  Outpatient Specialists:   Chief Complaint  Patient presents with  . Respiratory Distress       Brief Narrative   68 year old female with end-stage renal disease on hemodialysis (TTS), COPD with sarcoid, dementia who was admitted on 7/19 with acute respiratory distress, found to be severely hypoxic with O2 sat in the 50s per EMS.  They placed her on nonrebreather with some improvement in the 80s and then on CPAP however patient became obtunded.  Blood sugar was in the 300s.  In the ED chest x-ray showed chronic interstitial lung disease and COPD changes with increased BNP, lactic acid of 1.9.  SARS- COV-2 tested negative in the ED.  Patient intubated and admitted to the ICU. Patient placed on empiric vancomycin and cefepime. She was extubated on 7/20 and transferred to hospitalist service on 7/22.    Subjective:   Patient placed on mittens as she has been pulling out lines.  Also complains of pain in the back of her throat with her NG in place.  Felt better after it was removed.   Assessment  & Plan :   Principal problem Acute on chronic respiratory failure with hypoxemia (HCC) Secondary to volume overload from missed hemodialysis and possible left-sided pneumonia versus bronchitis.  Required intubation for airway protection. Tracheal aspirate from 7/19 with rare WBC and rare GPC in clusters. Patient was extubated on 7/20.  Now on Rocephin (will treat for total 7  days). Continue PRN bronchodilators.  Continue chronic prednisone for sarcoid.  Dementia with delirium. Not sure what her baseline dementia is.  Patient is confused and trying to pull out lines.  On mittens.  Continue Klonopin and Haldol as needed  Uncontrolled hypertension Continue Coreg and PRN hydralazine.  ESRD on hemodialysis (TTS) Hyperkalemia improved.  Renal following.  Leukocytosis No signs of active  infection at present.  Possibly due to IV steroid.  Will monitor for now  COPD with sarcoid Continue home dose prednisone.  PRN nebs.  Protein calorie malnutrition, unspecified Nutrition consulted  Generalized weakness PT recommends SNF   Code Status : Full code  Family Communication  : None  Disposition Plan  : SNF possibly in 1-2 days if continues to improve  Barriers For Discharge : Active symptoms  Consults  : Renal, PCCM  Procedures  : Intubation, CT head  DVT Prophylaxis  : Subcu heparin  Lab Results  Component Value Date   PLT 201 11/26/2018    Antibiotics  :    Anti-infectives (From admission, onward)   Start     Dose/Rate Route Frequency Ordered Stop   11/25/18 1013  cefTRIAXone (ROCEPHIN) 1 g in sodium chloride 0.9 % 100 mL IVPB     1 g 200 mL/hr over 30 Minutes Intravenous Every 24 hours 11/25/18 1002 11/29/18 1014   11/22/18 1800  ceFEPIme (MAXIPIME) 1 g in sodium chloride 0.9 % 100 mL IVPB  Status:  Discontinued     1 g 200 mL/hr over 30 Minutes Intravenous Every 24 hours 11/22/18 1530 11/25/18 1002   11/22/18 0600  vancomycin (VANCOCIN) IVPB 1000 mg/200 mL premix     1,000 mg 200 mL/hr over 60 Minutes Intravenous  Once 11/22/18 0553 11/22/18 0841   11/22/18 0600  ceFEPIme (MAXIPIME) 2 g in sodium chloride 0.9 % 100 mL IVPB     2 g 200 mL/hr over 30 Minutes Intravenous  Once 11/22/18 0553 11/22/18 0713        Objective:   Vitals:   11/25/18 2314 11/26/18 0244 11/26/18 0829 11/26/18 0901  BP: (!) 155/76  (!) 180/82   Pulse: 92  (!)  105 (!) 105  Resp: 20  16 16   Temp: 98.4 F (36.9 C)  98.7 F (37.1 C)   TempSrc:   Oral   SpO2: 99%  98% 98%  Weight:  48 kg    Height:        Wt Readings from Last 3 Encounters:  11/26/18 48 kg     Intake/Output Summary (Last 24 hours) at 11/26/2018 1524 Last data filed at 11/26/2018 0700 Gross per 24 hour  Intake 932.07 ml  Output -  Net 932.07 ml     Physical Exam  Gen: not in distress, restless, confused HEENT: Pallor present, moist mucosa, supple neck Chest: Diminished bibasilar breath sounds CVS: N S1&S2, no murmurs GI: soft, NT, ND, BS+ Musculoskeletal: warm, no edema     Data Review:    CBC Recent Labs  Lab 11/22/18 0547  11/23/18 0327 11/23/18 1631 11/24/18 0251 11/25/18 0245 11/26/18 0542  WBC 16.0*  --  15.4* 15.9* 17.2* 18.1* 20.1*  HGB 10.4*   < > 11.1* 10.2* 9.5* 10.1* 11.2*  HCT 34.9*   < > 34.1* 30.8* 29.6* 32.6* 36.0  PLT 188  --  217 193 122* 179 201  MCV 96.4  --  89.0 90.1 91.6 93.1 93.5  MCH 28.7  --  29.0 29.8 29.4 28.9 29.1  MCHC 29.8*  --  32.6 33.1 32.1 31.0 31.1  RDW 16.4*  --  17.2* 17.5* 17.2* 17.2* 17.0*  LYMPHSABS 1.1  --   --   --   --  0.9 1.2  MONOABS 0.2  --   --   --   --  1.6* 1.4*  EOSABS 0.1  --   --   --   --  0.2 0.3  BASOSABS 0.1  --   --   --   --  0.0 0.0   < > = values in this interval not displayed.    Chemistries  Recent Labs  Lab 11/22/18 0547  11/22/18 1445 11/23/18 0758 11/24/18 0251 11/24/18 1509 11/25/18 0245 11/25/18 1809 11/26/18 0542  NA 141   < > 135 131* 136  --  135  --  136  K 7.4*   < > 3.6 3.7 4.6  --  4.4  --  4.6  CL 101  --  95* 93* 98  --  96*  --  94*  CO2 24  --  23 22 26   --  25  --  23  GLUCOSE 244*  --  106* 91 78  --  134*  --  149*  BUN 80*  --  22 43* 22  --  37*  --  64*  CREATININE 12.81*  --  4.50* 7.34* 5.03*  --  4.46*  --  7.30*  CALCIUM 8.9  --  10.8* 9.0 8.4*  --  8.7*  --  8.8*  MG  --   --   --   --   --  2.0 2.2 2.4 2.4  AST 72*  --   --   --   --   --    --   --   --   ALT 45*  --   --   --   --   --   --   --   --   ALKPHOS 121  --   --   --   --   --   --   --   --   BILITOT 0.7  --   --   --   --   --   --   --   --    < > = values in this interval not displayed.   ------------------------------------------------------------------------------------------------------------------ No results for input(s): CHOL, HDL, LDLCALC, TRIG, CHOLHDL, LDLDIRECT in the last 72 hours.  No results found for: HGBA1C ------------------------------------------------------------------------------------------------------------------ No results for input(s): TSH, T4TOTAL, T3FREE, THYROIDAB in the last 72 hours.  Invalid input(s): FREET3 ------------------------------------------------------------------------------------------------------------------ No results for input(s): VITAMINB12, FOLATE, FERRITIN, TIBC, IRON, RETICCTPCT in the last 72 hours.  Coagulation profile No results for input(s): INR, PROTIME in the last 168 hours.  No results for input(s): DDIMER in the last 72 hours.  Cardiac Enzymes No results for input(s): CKMB, TROPONINI, MYOGLOBIN in the last 168 hours.  Invalid input(s): CK ------------------------------------------------------------------------------------------------------------------    Component Value Date/Time   BNP 974.6 (H) 11/22/2018 0600    Inpatient Medications  Scheduled Meds: . aspirin  325 mg Oral Daily  . B-complex with vitamin C  1 tablet Per Tube Daily  . budesonide (PULMICORT) nebulizer solution  0.25 mg Nebulization BID  . carvedilol  12.5 mg Per Tube BID WC  . Chlorhexidine Gluconate Cloth  6 each Topical Q0600  . famotidine  20 mg Per Tube Daily  . heparin  5,000 Units Subcutaneous Q8H  . insulin aspart  1-3 Units Subcutaneous Q4H  . isosorbide-hydrALAZINE  1 tablet Per Tube TID  . losartan  50 mg Oral Daily  . mouth rinse  15 mL Mouth Rinse BID  . predniSONE  10 mg Per Tube Q breakfast  . sennosides   10 mL Per Tube BID   Continuous Infusions: . sodium chloride Stopped (11/25/18 1744)  . cefTRIAXone (ROCEPHIN)  IV 1 g (  11/26/18 1041)  . dexmedetomidine (PRECEDEX) IV infusion Stopped (11/24/18 0802)  . feeding supplement (VITAL 1.5 CAL) 1,000 mL (11/24/18 1633)   PRN Meds:.acetaminophen (TYLENOL) oral liquid 160 mg/5 mL, albuterol, clonazepam, haloperidol lactate, heparin, hydrALAZINE, lidocaine (PF), lidocaine-prilocaine, ondansetron (ZOFRAN) IV, pentafluoroprop-tetrafluoroeth  Micro Results Recent Results (from the past 240 hour(s))  SARS Coronavirus 2 (CEPHEID - Performed in [place] hospital lab), Hosp Order     Status: None   Collection Time: 11/22/18  5:38 AM   Specimen: Nasopharyngeal Swab  Result Value Ref Range Status   SARS Coronavirus 2 NEGATIVE NEGATIVE Final    Comment: (NOTE) If result is NEGATIVE SARS-CoV-2 target nucleic acids are NOT DETECTED. The SARS-CoV-2 RNA is generally detectable in upper and lower  respiratory specimens during the acute phase of infection. The lowest  concentration of SARS-CoV-2 viral copies this assay can detect is 250  copies / mL. A negative result does not preclude SARS-CoV-2 infection  and should not be used as the sole basis for treatment or other  patient management decisions.  A negative result may occur with  improper specimen collection / handling, submission of specimen other  than nasopharyngeal swab, presence of viral mutation(s) within the  areas targeted by this assay, and inadequate number of viral copies  (<250 copies / mL). A negative result must be combined with clinical  observations, patient history, and epidemiological information. If result is POSITIVE SARS-CoV-2 target nucleic acids are DETECTED. The SARS-CoV-2 RNA is generally detectable in upper and lower  respiratory specimens dur ing the acute phase of infection.  Positive  results are indicative of active infection with SARS-CoV-2.  Clinical  correlation  with patient history and other diagnostic information is  necessary to determine patient infection status.  Positive results do  not rule out bacterial infection or co-infection with other viruses. If result is PRESUMPTIVE POSTIVE SARS-CoV-2 nucleic acids MAY BE PRESENT.   A presumptive positive result was obtained on the submitted specimen  and confirmed on repeat testing.  While 2019 novel coronavirus  (SARS-CoV-2) nucleic acids may be present in the submitted sample  additional confirmatory testing may be necessary for epidemiological  and / or clinical management purposes  to differentiate between  SARS-CoV-2 and other Sarbecovirus currently known to infect humans.  If clinically indicated additional testing with an alternate test  methodology [phone]) is advised. The SARS-CoV-2 RNA is generally  detectable in upper and lower respiratory sp ecimens during the acute  phase of infection. The expected result is Negative. Fact Sheet for Patients:  StrictlyIdeas.no Fact Sheet for Healthcare Providers: BankingDealers.co.za This test is not yet approved or cleared by the [place] FDA and has been authorized for detection and/or diagnosis of SARS-CoV-2 by FDA under an Emergency Use Authorization (EUA).  This EUA will remain in effect (meaning this test can be used) for the duration of the COVID-19 declaration under Section 564(b)(1) of the Act, 21 U.S.C. section 360bbb-3(b)(1), unless the authorization is terminated or revoked sooner. Performed at [facility], [place] [address], [place], [place] [address]   Blood culture (routine x 2)     Status: None (Preliminary result)   Collection Time: 11/22/18  6:00 AM   Specimen: BLOOD RIGHT HAND  Result Value Ref Range Status   Specimen Description BLOOD RIGHT HAND  Final   Special Requests   Final    Blood Culture results may not be optimal due to an inadequate volume of blood received  in culture bottles  Culture   Final    NO GROWTH 4 DAYS Performed at [facility], [place] [address] [place], [place] [address]    Report Status PENDING  Incomplete  Blood culture (routine x 2)     Status: None (Preliminary result)   Collection Time: 11/22/18  6:13 AM   Specimen: BLOOD RIGHT WRIST  Result Value Ref Range Status   Specimen Description BLOOD RIGHT WRIST  Final   Special Requests   Final    BOTTLES DRAWN AEROBIC AND ANAEROBIC Blood Culture results may not be optimal due to an inadequate volume of blood received in culture bottles   Culture   Final    NO GROWTH 4 DAYS Performed at [facility], [place] [address], [place], [place] [address]    Report Status PENDING  Incomplete  Culture, respiratory (non-expectorated)     Status: None   Collection Time: 11/22/18  9:50 AM   Specimen: Tracheal Aspirate; Respiratory  Result Value Ref Range Status   Specimen Description TRACHEAL ASPIRATE  Final   Special Requests NONE  Final   Gram Stain   Final    RARE WBC PRESENT, PREDOMINANTLY MONONUCLEAR RARE GRAM POSITIVE COCCI IN CLUSTERS    Culture   Final    Consistent with normal respiratory flora. Performed at [facility], [place] [address] [place], [place] [address]    Report Status 11/24/2018 FINAL  Final  MRSA PCR Screening     Status: None   Collection Time: 11/22/18 11:34 AM   Specimen: Nasal Mucosa; Nasopharyngeal  Result Value Ref Range Status   MRSA by PCR NEGATIVE NEGATIVE Final    Comment:        The GeneXpert MRSA Assay (FDA approved for NASAL specimens only), is one component of a comprehensive MRSA colonization surveillance program. It is not intended to diagnose MRSA infection nor to guide or monitor treatment for MRSA infections. Performed at [facility], [place] [address], [place],  [address]     Radiology Reports Dg Abd 1 View  Result Date: 11/22/2018 CLINICAL DATA:  NG tube placement. EXAM: ABDOMEN - 1 VIEW COMPARISON:   None. FINDINGS: Enteric tube appears adequately positioned in the stomach. Overall bowel gas pattern is nonobstructive. No evidence of free intraperitoneal air. IMPRESSION: Enteric tube appears adequately positioned in the stomach. Electronically Signed   By: Franki Cabot M.D.   On: 11/22/2018 11:21   Ct Head Wo Contrast  Result Date: 11/22/2018 CLINICAL DATA:  Found with altered mental status and difficulty breathing. EXAM: CT HEAD WITHOUT CONTRAST TECHNIQUE: Contiguous axial images were obtained from the base of the skull through the vertex without intravenous contrast. COMPARISON:  None. FINDINGS: Brain: Age related atrophy. Chronic small-vessel ischemic changes of the white matter. 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 Physiologic basal ganglia calcification. No sign of acute infarction, mass lesion, hemorrhage, hydrocephalus or extra-axial collection. Vascular: There is atherosclerotic calcification of the major vessels at the base of the brain. Skull: Negative Sinuses/Orbits: Scattered opacified ethmoid air cells. Orbits negative. Other: None IMPRESSION: No acute intracranial finding. Atrophy and chronic small-vessel ischemic change of the white matter. 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   On: 11/22/2018 09:26   Dg Chest Port 1 View  Result Date: 11/26/2018 CLINICAL DATA:  Abnormal respirations, confusion EXAM: PORTABLE CHEST 1 VIEW COMPARISON:  11/25/2018 FINDINGS: NG tube in place. The side port remains in the distal esophagus. Stable calcified  mediastinal and bilateral hilar lymph nodes. Calcified granulomas bilaterally. Cardiomegaly. No overt edema or effusions. No confluent opacity. IMPRESSION: Cardiomegaly. Old granulomatous disease. No acute findings. Electronically Signed   By: Rolm Baptise M.D.   On: 11/26/2018 09:27   Dg Chest Port 1 View  Result Date: 11/25/2018 CLINICAL DATA:  Respiratory failure. EXAM: PORTABLE CHEST 1 VIEW  COMPARISON:  Radiograph of November 24, 2018. FINDINGS: Stable cardiomegaly. Stable calcified mediastinal and bilateral hilar adenopathy. Interval placement of nasogastric tube with tip in proximal stomach. Side hole remains above the gastroesophageal junction. Old right rib fractures are noted. No pneumothorax is noted. Minimal right pleural effusion is noted. No consolidative process is noted. Bony thorax is unremarkable. IMPRESSION: Interval placement of a nasogastric tube with distal tip in proximal stomach, although side hole remains above gastroesophageal junction; advancement is recommended. Stable calcified mediastinal bilateral hilar adenopathy. Stable small pleural effusion. No other abnormality seen. Electronically Signed   By: Marijo Conception M.D.   On: 11/25/2018 08:24   Dg Chest Port 1 View  Result Date: 11/24/2018 CLINICAL DATA:  Acute respiratory failure with hypoxia. EXAM: PORTABLE CHEST 1 VIEW COMPARISON:  11/23/2018. FINDINGS: Interval removal of the endotracheal tube and nasogastric tube. Mild enlargement of the cardiac silhouette with mild progression. The lungs remain hyperexpanded with a small amount of bilateral pleural thickening or fluid and stable right basilar scarring. Stable mild prominence of the interstitial markings. Stable calcified lymph nodes in granulomata. Mild scoliosis. IMPRESSION: 1. Mildly progressive cardiomegaly. 2. Stable changes of COPD. 3. Stable small amount of bilateral pleural thickening or fluid. Electronically Signed   By: Claudie Revering M.D.   On: 11/24/2018 08:16   Dg Chest Port 1 View  Result Date: 11/23/2018 CLINICAL DATA:  Acute respiratory failure. EXAM: PORTABLE CHEST 1 VIEW COMPARISON:  11/22/2018. FINDINGS: Endotracheal tube and NG tube in stable position. Heart size normal. Changes consistent with COPD and pleuroparenchymal scarring again noted. Mild left mid lung and bibasilar atelectasis/infiltrates cannot be excluded. Small right pleural effusion  versus pleural scarring. No pneumothorax. No acute bony abnormality. IMPRESSION: 1.  Endotracheal tube and NG tube in stable position. 2. Changes consistent COPD and pleuroparenchymal scarring again noted. Mild left mid lung and bibasilar atelectasis/infiltrates cannot be excluded. Small right pleural effusion versus pleural scarring. No pneumothorax. Electronically Signed   By: Marcello Moores  Register   On: 11/23/2018 08:09   Dg Chest Portable 1 View  Result Date: 11/22/2018 CLINICAL DATA:  Intubated for shortness of breath. EXAM: PORTABLE CHEST 1 VIEW COMPARISON:  None. FINDINGS: Endotracheal tube is well positioned with tip approximately 5 cm above the carina. Enteric tube passes below the diaphragm. Lungs are hyperexpanded. Coarse lung markings bilaterally suggesting some degree of chronic interstitial lung disease/fibrosis. Superimposed edema versus pneumonia in the LEFT perihilar lung. Small bilateral pleural effusions, versus more likely chronic bilateral pleural thickening. No pneumothorax seen. No acute appearing osseous abnormality. IMPRESSION: 1. Endotracheal tube well positioned with tip approximately 5 cm above the carina. 2. Enteric tube passes below the diaphragm. 3. Hyperexpanded lungs indicating COPD. Coarse lung markings bilaterally suggesting some degree of chronic interstitial lung disease/fibrosis. 4. Superimposed edema versus pneumonia in the LEFT perihilar lung. Electronically Signed   By: Franki Cabot M.D.   On: 11/22/2018 06:04   Dg Abd Portable 1v  Result Date: 11/24/2018 CLINICAL DATA:  NG tube placement EXAM: PORTABLE ABDOMEN - 1 VIEW COMPARISON:  11/22/2018 FINDINGS: NG tube tip is in the proximal stomach with the side port in  the distal esophagus. IMPRESSION: NG tube in the proximal stomach with the tip in the distal esophagus. Electronically Signed   By: Rolm Baptise M.D.   On: 11/24/2018 09:11    Time Spent in minutes  35   Turkessa Ostrom M.D on 11/26/2018 at 3:24 PM   Between 7am to 7pm - Pager - [phone]  After 7pm go to [email or web address] - password [facility]  [facility] -  Office  [phone]

## 2018-11-26 NOTE — Progress Notes (Signed)
Admit: 11/22/2018 LOS: 4  50F ESRD THS [place] with missed HD, inc K, acute resp failure  Subjective:  Merrie Roof but more awake . No new interval events  07/22 0701 - 07/23 0700 In: 1717.1 [I.V.:102; NG/GT:1515; IV Piggyback:100.1] Out: -   Filed Weights   11/24/18 0827 11/24/18 1207 11/26/18 0244  Weight: 47.3 kg 45 kg 48 kg    Scheduled Meds: . aspirin  325 mg Oral Daily  . B-complex with vitamin C  1 tablet Per Tube Daily  . budesonide (PULMICORT) nebulizer solution  0.25 mg Nebulization BID  . carvedilol  12.5 mg Per Tube BID WC  . Chlorhexidine Gluconate Cloth  6 each Topical Q0600  . famotidine  20 mg Per Tube Daily  . heparin  5,000 Units Subcutaneous Q8H  . insulin aspart  1-3 Units Subcutaneous Q4H  . isosorbide-hydrALAZINE  1 tablet Per Tube TID  . losartan  50 mg Oral Daily  . mouth rinse  15 mL Mouth Rinse BID  . predniSONE  10 mg Per Tube Q breakfast  . sennosides  10 mL Per Tube BID   Continuous Infusions: . sodium chloride Stopped (11/25/18 1744)  . cefTRIAXone (ROCEPHIN)  IV 1 g (11/26/18 1041)  . dexmedetomidine (PRECEDEX) IV infusion Stopped (11/24/18 0802)  . feeding supplement (VITAL 1.5 CAL) 1,000 mL (11/24/18 1633)   PRN Meds:.acetaminophen (TYLENOL) oral liquid 160 mg/5 mL, albuterol, clonazepam, haloperidol lactate, heparin, hydrALAZINE, lidocaine (PF), lidocaine-prilocaine, ondansetron (ZOFRAN) IV, pentafluoroprop-tetrafluoroeth  Current Labs: reviewed    Physical Exam:  Blood pressure (!) 180/82, pulse (!) 105, temperature 98.7 F (37.1 C), temperature source Oral, resp. rate 16, height 5\' 8"  (1.727 m), weight 48 kg, SpO2 98 %. Sleepy, arouses to voice RRR nl s1s2 CTAB No LEE  L FA AV acces +B/T S/nt/nd NCAT EOMI  A 1. ESRD THS; [place], L FA AVG 2. Acute on chronic RF, stable; pulm edema; improving 3. CKD BMD: stable 4. Anemia: Hb stable 5. HTN/VolBP stable, cont PO  meds 6. Dementia 7. COPD 8. Sarcoid 9. Hyperkalemia, K improved  P . Cont HD on schedule: 1-2L UF, 2K, 3h, Tight heparin . Medication Issues; o Preferred narcotic agents for pain control are hydromorphone, fentanyl, and methadone. Morphine should not be used.  o Baclofen should be avoided o Avoid oral sodium phosphate and magnesium citrate based laxatives / bowel preps    Pearson Grippe MD 11/26/2018, 11:22 AM  Recent Labs  Lab 11/24/18 0251  11/25/18 0245 11/25/18 1809 11/26/18 0542  NA 136  --  135  --  136  K 4.6  --  4.4  --  4.6  CL 98  --  96*  --  94*  CO2 26  --  25  --  23  GLUCOSE 78  --  134*  --  149*  BUN 22  --  37*  --  64*  CREATININE 5.03*  --  4.46*  --  7.30*  CALCIUM 8.4*  --  8.7*  --  8.8*  PHOS  --    < > 5.7* 5.8* 5.1*   < > = values in this interval not displayed.   Recent Labs  Lab 11/22/18 0547  11/24/18 0251 11/25/18 0245 11/26/18 0542  WBC 16.0*   < > 17.2* 18.1* 20.1*  NEUTROABS 14.5*  --   --  15.3* 17.2*  HGB 10.4*   < > 9.5* 10.1* 11.2*  HCT 34.9*   < > 29.6* 32.6*  36.0  MCV 96.4   < > 91.6 93.1 93.5  PLT 188   < > 122* 179 201   < > = values in this interval not displayed.

## 2018-11-27 DIAGNOSIS — M6281 Muscle weakness (generalized): Secondary | ICD-10-CM

## 2018-11-27 DIAGNOSIS — E162 Hypoglycemia, unspecified: Secondary | ICD-10-CM

## 2018-11-27 DIAGNOSIS — F0150 Vascular dementia without behavioral disturbance: Secondary | ICD-10-CM

## 2018-11-27 LAB — CULTURE, BLOOD (ROUTINE X 2)
Culture: NO GROWTH
Culture: NO GROWTH

## 2018-11-27 LAB — GLUCOSE, CAPILLARY
Glucose-Capillary: 114 mg/dL — ABNORMAL HIGH (ref 70–99)
Glucose-Capillary: 121 mg/dL — ABNORMAL HIGH (ref 70–99)
Glucose-Capillary: 124 mg/dL — ABNORMAL HIGH (ref 70–99)
Glucose-Capillary: 147 mg/dL — ABNORMAL HIGH (ref 70–99)
Glucose-Capillary: 158 mg/dL — ABNORMAL HIGH (ref 70–99)
Glucose-Capillary: 160 mg/dL — ABNORMAL HIGH (ref 70–99)

## 2018-11-27 LAB — RENAL FUNCTION PANEL
Albumin: 3.8 g/dL (ref 3.5–5.0)
Anion gap: 19 — ABNORMAL HIGH (ref 5–15)
BUN: 42 mg/dL — ABNORMAL HIGH (ref 8–23)
CO2: 21 mmol/L — ABNORMAL LOW (ref 22–32)
Calcium: 9.5 mg/dL (ref 8.9–10.3)
Chloride: 97 mmol/L — ABNORMAL LOW (ref 98–111)
Creatinine, Ser: 6.17 mg/dL — ABNORMAL HIGH (ref 0.44–1.00)
GFR calc Af Amer: 7 mL/min — ABNORMAL LOW (ref >60)
GFR calc non Af Amer: 6 mL/min — ABNORMAL LOW (ref >60)
Glucose, Bld: 178 mg/dL — ABNORMAL HIGH (ref 70–99)
Phosphorus: 4.7 mg/dL — ABNORMAL HIGH (ref 2.5–4.6)
Potassium: 6.6 mmol/L (ref 3.5–5.1)
Sodium: 137 mmol/L (ref 135–145)

## 2018-11-27 LAB — CBC
HCT: 34.1 % — ABNORMAL LOW (ref 36.0–46.0)
Hemoglobin: 11 g/dL — ABNORMAL LOW (ref 12.0–15.0)
MCH: 28.8 pg (ref 26.0–34.0)
MCHC: 32.3 g/dL (ref 30.0–36.0)
MCV: 89.3 fL (ref 80.0–100.0)
Platelets: 227 10*3/uL (ref 150–400)
RBC: 3.82 MIL/uL — ABNORMAL LOW (ref 3.87–5.11)
RDW: 16.6 % — ABNORMAL HIGH (ref 11.5–15.5)
WBC: 11.3 10*3/uL — ABNORMAL HIGH (ref 4.0–10.5)
nRBC: 0 % (ref 0.0–0.2)

## 2018-11-27 LAB — POTASSIUM: Potassium: 4.9 mmol/L (ref 3.5–5.1)

## 2018-11-27 MED ORDER — BENZONATATE 100 MG PO CAPS
200.0000 mg | ORAL_CAPSULE | Freq: Three times a day (TID) | ORAL | Status: DC | PRN
  Administered 2018-11-27 – 2018-11-29 (×3): 200 mg via ORAL
  Filled 2018-11-27 (×3): qty 2

## 2018-11-27 NOTE — Progress Notes (Signed)
Admit: 11/22/2018 LOS: 5  45F ESRD THS [place] with missed HD, inc K, acute resp failure  Subjective:  . K6.6 today, some hemolysis present, being repeated . HD overnight, 2.5 L removed . No new interval events  07/23 0701 - 07/24 0700 In: 150 [NG/GT:150] Out: 2507   Filed Weights   11/26/18 2318 11/27/18 0308 11/27/18 0316  Weight: 45.3 kg 43 kg 41.2 kg    Scheduled Meds: . aspirin  325 mg Oral Daily  . B-complex with vitamin C  1 tablet Oral Daily  . budesonide (PULMICORT) nebulizer solution  0.25 mg Nebulization BID  . carvedilol  12.5 mg Oral BID WC  . Chlorhexidine Gluconate Cloth  6 each Topical Q0600  . famotidine  20 mg Oral Daily  . heparin  5,000 Units Subcutaneous Q8H  . isosorbide-hydrALAZINE  1 tablet Oral TID  . losartan  50 mg Oral Daily  . mouth rinse  15 mL Mouth Rinse BID  . predniSONE  10 mg Oral Q breakfast  . senna  1 tablet Oral BID   Continuous Infusions: . sodium chloride Stopped (11/25/18 1744)  . cefTRIAXone (ROCEPHIN)  IV 1 g (11/27/18 1305)  . feeding supplement (VITAL 1.5 CAL) Stopped (11/26/18 1030)   PRN Meds:.acetaminophen (TYLENOL) oral liquid 160 mg/5 mL, albuterol, clonazepam, haloperidol lactate, hydrALAZINE, ondansetron (ZOFRAN) IV  Current Labs: reviewed    Physical Exam:  Blood pressure (!) 154/103, pulse (!) 103, temperature 98.6 F (37 C), temperature source Oral, resp. rate 18, height 5\' 8"  (1.727 m), weight 41.2 kg, SpO2 100 %. Awake, conversant, some confusion RRR nl s1s2 CTAB No LEE  L FA AV acces +B/T S/nt/nd NCAT EOMI  A 1. ESRD THS; [place], L FA AVG 2. 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 CKD BMD: stable 4. Anemia: Hb stable 5. HTN/VolBP stable, cont PO meds 6. Dementia 7. COPD 8. Sarcoid 9. Hyperkalemia, today's elevated value could be spurious, repeat pending  P . Cont HD THS on schedule: 1-2L UF, 2K, 3h, Tight heparin . Medication  Issues; o Preferred narcotic agents for pain control are hydromorphone, fentanyl, and methadone. Morphine should not be used.  o Baclofen should be avoided o Avoid oral sodium phosphate and magnesium citrate based laxatives / bowel preps    Pearson Grippe MD 11/27/2018, 3:03 PM  Recent Labs  Lab 11/25/18 0245 11/25/18 1809 11/26/18 0542 11/27/18 1250  NA 135  --  136 137  K 4.4  --  4.6 6.6*  CL 96*  --  94* 97*  CO2 25  --  23 21*  GLUCOSE 134*  --  149* 178*  BUN 37*  --  64* 42*  CREATININE 4.46*  --  7.30* 6.17*  CALCIUM 8.7*  --  8.8* 9.5  PHOS 5.7* 5.8* 5.1* 4.7*   Recent Labs  Lab 11/22/18 0547  11/25/18 0245 11/26/18 0542 11/27/18 1250  WBC 16.0*   < > 18.1* 20.1* 11.3*  NEUTROABS 14.5*  --  15.3* 17.2*  --   HGB 10.4*   < > 10.1* 11.2* 11.0*  HCT 34.9*   < > 32.6* 36.0 34.1*  MCV 96.4   < > 93.1 93.5 89.3  PLT 188   < > 179 201 227   < > = values in this interval not displayed.

## 2018-11-27 NOTE — Progress Notes (Signed)
[facility] informed that patient has dry non productive cough. Arthor Captain LPN

## 2018-11-27 NOTE — Progress Notes (Signed)
Physical Therapy Treatment Patient Details Name: Molly Roberts MRN: [id number] DOB: 04/11/1951 Today's Date: 11/27/2018    History of Present Illness Pt adm with acute respiratory distress and required intubation 7/19-7/20. Pt had been visiting from out of town and missed HD session prior. PMH - ESRD on HD, copd, dementia, sarcoid.     PT Comments    Pt much improved today. Continues to need assist for mobility but if family can provide some initial assist could return home with HHPT. If they can't provide assist pt will need SNF.    Follow Up Recommendations  Supervision/Assistance - 24 hour;Home health PT(if family can provide initial support)     Equipment Recommendations  None recommended by PT    Recommendations for Other Services       Precautions / Restrictions Precautions Precautions: Fall Restrictions Weight Bearing Restrictions: No    Mobility  Bed Mobility Overal bed mobility: Needs Assistance Bed Mobility: Sidelying to Sit   Sidelying to sit: Supervision       General bed mobility comments: supervision for safety  Transfers Overall transfer level: Needs assistance Equipment used: 4-wheeled walker;Rolling walker (2 wheeled) Transfers: Sit to/from Stand Sit to Stand: Min assist         General transfer comment: Assist for balance and safety  Ambulation/Gait Ambulation/Gait assistance: Min assist;Min guard Gait Distance (Feet): 175 Feet Assistive device: 4-wheeled walker;Rolling walker (2 wheeled) Gait Pattern/deviations: Step-through pattern;Decreased stride length(knees beginning to buckle) Gait velocity: decr Gait velocity interpretation: 1.31 - 2.62 ft/sec, indicative of limited community ambulator General Gait Details: Assist for balance and safety   Stairs             Wheelchair Mobility    Modified Rankin (Stroke Patients Only)       Balance Overall balance assessment: Needs assistance Sitting-balance support: Feet  supported;No upper extremity supported Sitting balance-Leahy Scale: Good     Standing balance support: Single extremity supported Standing balance-Leahy Scale: Poor Standing balance comment: UE support                            Cognition Arousal/Alertness: Awake/alert Behavior During Therapy: Restless Overall Cognitive Status: No family/caregiver present to determine baseline cognitive functioning Area of Impairment: Orientation;Attention;Memory;Following commands;Safety/judgement;Problem solving                 Orientation Level: Disoriented to;Time;Situation Current Attention Level: Sustained Memory: Decreased short-term memory Following Commands: Follows one step commands consistently Safety/Judgement: Decreased awareness of safety;Decreased awareness of deficits   Problem Solving: Requires verbal cues General Comments: Pt with improved cognition compared to eval. Remain unsure of baseline      Exercises      General Comments        Pertinent Vitals/Pain Pain Assessment: No/denies pain    Home Living                      Prior Function            PT Goals (current goals can now be found in the care plan section) Acute Rehab PT Goals Patient Stated Goal: go home Progress towards PT goals: Progressing toward goals    Frequency    Min 3X/week      PT Plan Discharge plan needs to be updated    Co-evaluation              AM-PAC PT "6 Clicks" Mobility   Outcome Measure  Help needed  turning from your back to your side while in a flat bed without using bedrails?: None Help needed moving from lying on your back to sitting on the side of a flat bed without using bedrails?: None Help needed moving to and from a bed to a chair (including a wheelchair)?: A Little Help needed standing up from a chair using your arms (e.g., wheelchair or bedside chair)?: A Little Help needed to walk in hospital room?: A Little Help needed  climbing 3-5 steps with a railing? : A Little 6 Click Score: 20    End of Session Equipment Utilized During Treatment: Gait belt Activity Tolerance: Patient tolerated treatment well Patient left: with call bell/phone within reach;with restraints reapplied;in chair;with chair alarm set Nurse Communication: Mobility status PT Visit Diagnosis: Unsteadiness on feet (R26.81);Muscle weakness (generalized) (M62.81)     Time: 3361-2244 PT Time Calculation (min) (ACUTE ONLY): 14 min  Charges:  $Gait Training: 8-22 mins                     Bear Grass Pager [phone] Office [place] 11/27/2018, 12:36 PM

## 2018-11-27 NOTE — Progress Notes (Addendum)
PROGRESS NOTE                                                                                                                                                                                                             Patient Demographics:    Charnelle Iacovelli, is a 68 y.o. female, DOB - Oct 19, 1950, DUK:[id number]  Admit date - 11/22/2018   Admitting Physician Rigoberto Noel, MD  Outpatient Primary MD for the patient is No primary care provider on file.  LOS - 5  Outpatient Specialists:   Chief Complaint  Patient presents with   Respiratory Distress       Brief Narrative   68 year old female with end-stage renal disease on hemodialysis (TTS), COPD with sarcoid, dementia who was admitted on 7/19 with acute respiratory distress, found to be severely hypoxic with O2 sat in the 50s per EMS.  They placed her on nonrebreather with some improvement in the 80s and then on CPAP however patient became obtunded.  Blood sugar was in the 300s.  In the ED chest x-ray showed chronic interstitial lung disease and COPD changes with increased BNP, lactic acid of 1.9.  SARS- COV-2 tested negative in the ED.  Patient intubated and admitted to the ICU. Patient placed on empiric vancomycin and cefepime. She was extubated on 7/20 and transferred to hospitalist service on 7/22.    Subjective:   Patient less confused today.  Was able to answer questions well with occasional confusion, knew she was in the hospital and tells me that she lives now in [place] with her daughter and was also able to tell me where she goes for dialysis.  (Verified with her daughter ).,  Feels tired.  Overnight was hypoglycemic with blood sugar in the 30s.  (Had had received some insulin on corrected sliding scale).  Has not been agitated or restless today.   Assessment  & Plan :   Principal problem Acute on chronic respiratory failure with hypoxemia (HCC) Secondary to  volume overload from missed hemodialysis and possible left-sided pneumonia versus bronchitis.  Required intubation for airway protection. Tracheal aspirate from 7/19 with rare WBC and rare GPC in clusters. Patient was extubated on 7/20.  Now on Rocephin (plan to treat for total 7 days). Continue PRN bronchodilators.  Continue chronic prednisone for sarcoid.  Active problems  Hypoglycemia on 7/24  No history of diabetes.  CBG dropped to 30s.  Appears that she received sliding scale correction of her insulin yesterday in the setting of poor p.o. intake.  This has been discontinued.  Has good p.o. intake today.    Dementia with delirium. Patient much oriented today during conversation.  As per her daughter patient has some mild forgetfulness (short-term memory loss) which started after she had a mini stroke 4 years ago.  At baseline she is capable of her ADLs and occasionally also manages her accounts. Has not required Haldol.  Will discontinue restraints if no further issues.  Uncontrolled hypertension Continue Coreg and PRN hydralazine.  ESRD on hemodialysis (TTS) Hyperkalemic on follow-up labs today.  Had dialysis yesterday.  Suspect hemolysis.  Will recheck potassium.  Leukocytosis No signs of active infection at present.  Possibly due to IV steroid.  Improved this a.m.  COPD with sarcoid Continue home dose prednisone.  PRN nebs.  Protein calorie malnutrition, unspecified Nutrition consulted  Generalized weakness PT recommends 24-hour home supervision.  Daughter says that both she and her sister can provide enough supervision for her at home.   Code Status : Full code  Family Communication  : spoke with daughter on the phone  Disposition Plan  : home with 24 hr supervision possibly tomorrow  Barriers For Discharge : Active symptoms  Consults  : Renal, PCCM  Procedures  : Intubation, CT head  DVT Prophylaxis  : Subcu heparin  Lab Results  Component Value Date   PLT 227  11/27/2018    Antibiotics  :    Anti-infectives (From admission, onward)   Start     Dose/Rate Route Frequency Ordered Stop   11/25/18 1013  cefTRIAXone (ROCEPHIN) 1 g in sodium chloride 0.9 % 100 mL IVPB     1 g 200 mL/hr over 30 Minutes Intravenous Every 24 hours 11/25/18 1002 11/29/18 1014   11/22/18 1800  ceFEPIme (MAXIPIME) 1 g in sodium chloride 0.9 % 100 mL IVPB  Status:  Discontinued     1 g 200 mL/hr over 30 Minutes Intravenous Every 24 hours 11/22/18 1530 11/25/18 1002   11/22/18 0600  vancomycin (VANCOCIN) IVPB 1000 mg/200 mL premix     1,000 mg 200 mL/hr over 60 Minutes Intravenous  Once 11/22/18 0553 11/22/18 0841   11/22/18 0600  ceFEPIme (MAXIPIME) 2 g in sodium chloride 0.9 % 100 mL IVPB     2 g 200 mL/hr over 30 Minutes Intravenous  Once 11/22/18 0553 11/22/18 0713        Objective:   Vitals:   11/27/18 0308 11/27/18 0316 11/27/18 0847 11/27/18 0847  BP: 131/75 101/60  (!) 154/103  Pulse: (!) 113 (!) 101  (!) 103  Resp:  14  18  Temp: 98.2 F (36.8 C) 98 F (36.7 C)  98.6 F (37 C)  TempSrc: Oral Oral  Oral  SpO2: 100% 100% 100% 100%  Weight: 43 kg 41.2 kg    Height:        Wt Readings from Last 3 Encounters:  11/27/18 41.2 kg     Intake/Output Summary (Last 24 hours) at 11/27/2018 1348 Last data filed at 11/27/2018 0847 Gross per 24 hour  Intake 240 ml  Output 2507 ml  Net -2267 ml    Physical exam Fatigued, not in distress HEENT: Moist mucosa, supple neck Chest: Clear bilaterally CVs: Normal S1-S2 GI: Soft, nondistended, nontender Musculoskeletal: Warm, no edema CNS: Alert and oriented x2     Data Review:  CBC Recent Labs  Lab 11/22/18 0547  11/23/18 1631 11/24/18 0251 11/25/18 0245 11/26/18 0542 11/27/18 1250  WBC 16.0*   < > 15.9* 17.2* 18.1* 20.1* 11.3*  HGB 10.4*   < > 10.2* 9.5* 10.1* 11.2* 11.0*  HCT 34.9*   < > 30.8* 29.6* 32.6* 36.0 34.1*  PLT 188   < > 193 122* 179 201 227  MCV 96.4   < > 90.1 91.6 93.1 93.5  89.3  MCH 28.7   < > 29.8 29.4 28.9 29.1 28.8  MCHC 29.8*   < > 33.1 32.1 31.0 31.1 32.3  RDW 16.4*   < > 17.5* 17.2* 17.2* 17.0* 16.6*  LYMPHSABS 1.1  --   --   --  0.9 1.2  --   MONOABS 0.2  --   --   --  1.6* 1.4*  --   EOSABS 0.1  --   --   --  0.2 0.3  --   BASOSABS 0.1  --   --   --  0.0 0.0  --    < > = values in this interval not displayed.    Chemistries  Recent Labs  Lab 11/22/18 0547  11/23/18 0758 11/24/18 0251 11/24/18 1509 11/25/18 0245 11/25/18 1809 11/26/18 0542 11/27/18 1250  NA 141   < > 131* 136  --  135  --  136 137  K 7.4*   < > 3.7 4.6  --  4.4  --  4.6 6.6*  CL 101   < > 93* 98  --  96*  --  94* 97*  CO2 24   < > 22 26  --  25  --  23 21*  GLUCOSE 244*   < > 91 78  --  134*  --  149* 178*  BUN 80*   < > 43* 22  --  37*  --  64* 42*  CREATININE 12.81*   < > 7.34* 5.03*  --  4.46*  --  7.30* 6.17*  CALCIUM 8.9   < > 9.0 8.4*  --  8.7*  --  8.8* 9.5  MG  --   --   --   --  2.0 2.2 2.4 2.4  --   AST 72*  --   --   --   --   --   --   --   --   ALT 45*  --   --   --   --   --   --   --   --   ALKPHOS 121  --   --   --   --   --   --   --   --   BILITOT 0.7  --   --   --   --   --   --   --   --    < > = values in this interval not displayed.   ------------------------------------------------------------------------------------------------------------------ No results for input(s): CHOL, HDL, LDLCALC, TRIG, CHOLHDL, LDLDIRECT in the last 72 hours.  No results found for: HGBA1C ------------------------------------------------------------------------------------------------------------------ No results for input(s): TSH, T4TOTAL, T3FREE, THYROIDAB in the last 72 hours.  Invalid input(s): FREET3 ------------------------------------------------------------------------------------------------------------------ No results for input(s): VITAMINB12, FOLATE, FERRITIN, TIBC, IRON, RETICCTPCT in the last 72 hours.  Coagulation profile No results for input(s):  INR, PROTIME in the last 168 hours.  No results for input(s): DDIMER in the last 72 hours.  Cardiac Enzymes No results for input(s): CKMB, TROPONINI, MYOGLOBIN in the  last 168 hours.  Invalid input(s): CK ------------------------------------------------------------------------------------------------------------------    Component Value Date/Time   BNP 974.6 (H) 11/22/2018 0600    Inpatient Medications  Scheduled Meds:  aspirin  325 mg Oral Daily   B-complex with vitamin C  1 tablet Oral Daily   budesonide (PULMICORT) nebulizer solution  0.25 mg Nebulization BID   carvedilol  12.5 mg Oral BID WC   Chlorhexidine Gluconate Cloth  6 each Topical Q0600   famotidine  20 mg Oral Daily   heparin  5,000 Units Subcutaneous Q8H   isosorbide-hydrALAZINE  1 tablet Oral TID   losartan  50 mg Oral Daily   mouth rinse  15 mL Mouth Rinse BID   predniSONE  10 mg Oral Q breakfast   senna  1 tablet Oral BID   Continuous Infusions:  sodium chloride Stopped (11/25/18 1744)   cefTRIAXone (ROCEPHIN)  IV 1 g (11/27/18 1305)   feeding supplement (VITAL 1.5 CAL) Stopped (11/26/18 1030)   PRN Meds:.acetaminophen (TYLENOL) oral liquid 160 mg/5 mL, albuterol, clonazepam, haloperidol lactate, hydrALAZINE, ondansetron (ZOFRAN) IV  Micro Results Recent Results (from the past 240 hour(s))  SARS Coronavirus 2 (CEPHEID - Performed in [place] hospital lab), Hosp Order     Status: None   Collection Time: 11/22/18  5:38 AM   Specimen: Nasopharyngeal Swab  Result Value Ref Range Status   SARS Coronavirus 2 NEGATIVE NEGATIVE Final    Comment: (NOTE) If result is NEGATIVE SARS-CoV-2 target nucleic acids are NOT DETECTED. The SARS-CoV-2 RNA is generally detectable in upper and lower  respiratory specimens during the acute phase of infection. The lowest  concentration of SARS-CoV-2 viral copies this assay can detect is 250  copies / mL. A negative result does not preclude SARS-CoV-2  infection  and should not be used as the sole basis for treatment or other  patient management decisions.  A negative result may occur with  improper specimen collection / handling, submission of specimen other  than nasopharyngeal swab, presence of viral mutation(s) within the  areas targeted by this assay, and inadequate number of viral copies  (<250 copies / mL). A negative result must be combined with clinical  observations, patient history, and epidemiological information. If result is POSITIVE SARS-CoV-2 target nucleic acids are DETECTED. The SARS-CoV-2 RNA is generally detectable in upper and lower  respiratory specimens dur ing the acute phase of infection.  Positive  results are indicative of active infection with SARS-CoV-2.  Clinical  correlation with patient history and other diagnostic information is  necessary to determine patient infection status.  Positive results do  not rule out bacterial infection or co-infection with other viruses. If result is PRESUMPTIVE POSTIVE SARS-CoV-2 nucleic acids MAY BE PRESENT.   A presumptive positive result was obtained on the submitted specimen  and confirmed on repeat testing.  While 2019 novel coronavirus  (SARS-CoV-2) nucleic acids may be present in the submitted sample  additional confirmatory testing may be necessary for epidemiological  and / or clinical management purposes  to differentiate between  SARS-CoV-2 and other Sarbecovirus currently known to infect humans.  If clinically indicated additional testing with an alternate test  methodology [phone]) is advised. The SARS-CoV-2 RNA is generally  detectable in upper and lower respiratory sp ecimens during the acute  phase of infection. The expected result is Negative. Fact Sheet for Patients:  StrictlyIdeas.no Fact Sheet for Healthcare Providers: BankingDealers.co.za This test is not yet approved or cleared by the [place]  FDA and has been  authorized for detection and/or diagnosis of SARS-CoV-2 by FDA under an Emergency Use Authorization (EUA).  This EUA will remain in effect (meaning this test can be used) for the duration of the COVID-19 declaration under Section 564(b)(1) of the Act, 21 U.S.C. section 360bbb-3(b)(1), unless the authorization is terminated or revoked sooner. Performed at [facility], [place] [address] [place], [place] [address]   Blood culture (routine x 2)     Status: None   Collection Time: 11/22/18  6:00 AM   Specimen: BLOOD RIGHT HAND  Result Value Ref Range Status   Specimen Description BLOOD RIGHT HAND  Final   Special Requests   Final    Blood Culture results may not be optimal due to an inadequate volume of blood received in culture bottles   Culture   Final    NO GROWTH 5 DAYS Performed at [facility], [place] [address], [place], [place] [address]    Report Status 11/27/2018 FINAL  Final  Blood culture (routine x 2)     Status: None   Collection Time: 11/22/18  6:13 AM   Specimen: BLOOD RIGHT WRIST  Result Value Ref Range Status   Specimen Description BLOOD RIGHT WRIST  Final   Special Requests   Final    BOTTLES DRAWN AEROBIC AND ANAEROBIC Blood Culture results may not be optimal due to an inadequate volume of blood received in culture bottles   Culture   Final    NO GROWTH 5 DAYS Performed at [facility], [place] [address], [place], [place] [address]    Report Status 11/27/2018 FINAL  Final  Culture, respiratory (non-expectorated)     Status: None   Collection Time: 11/22/18  9:50 AM   Specimen: Tracheal Aspirate; Respiratory  Result Value Ref Range Status   Specimen Description TRACHEAL ASPIRATE  Final   Special Requests NONE  Final   Gram Stain   Final    RARE WBC PRESENT, PREDOMINANTLY MONONUCLEAR RARE GRAM POSITIVE COCCI IN CLUSTERS    Culture   Final    Consistent with normal respiratory flora. Performed at [facility], [place]  [address] [place], [place] [address]    Report Status 11/24/2018 FINAL  Final  MRSA PCR Screening     Status: None   Collection Time: 11/22/18 11:34 AM   Specimen: Nasal Mucosa; Nasopharyngeal  Result Value Ref Range Status   MRSA by PCR NEGATIVE NEGATIVE Final    Comment:        The GeneXpert MRSA Assay (FDA approved for NASAL specimens only), is one component of a comprehensive MRSA colonization surveillance program. It is not intended to diagnose MRSA infection nor to guide or monitor treatment for MRSA infections. Performed at [facility], [place] [address] [place], [place] [address]     Radiology Reports Dg Abd 1 View  Result Date: 11/22/2018 CLINICAL DATA:  NG tube placement. EXAM: ABDOMEN - 1 VIEW COMPARISON:  None. FINDINGS: Enteric tube appears adequately positioned in the stomach. Overall bowel gas pattern is nonobstructive. No evidence of free intraperitoneal air. IMPRESSION: Enteric tube appears adequately positioned in the stomach. Electronically Signed   By: Franki Cabot M.D.   On: 11/22/2018 11:21   Ct Head Wo Contrast  Result Date: 11/22/2018 CLINICAL DATA:  Found with altered mental status and difficulty breathing. EXAM: CT HEAD WITHOUT CONTRAST TECHNIQUE: Contiguous axial images were obtained from the base of the skull through the vertex without intravenous contrast. COMPARISON:  None. FINDINGS: Brain: Age related atrophy. Chronic small-vessel ischemic changes of the white matter. 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 Physiologic basal ganglia calcification. No sign of acute infarction, mass lesion, hemorrhage, hydrocephalus or extra-axial collection. Vascular: There is atherosclerotic calcification of the major vessels at the base of the brain. Skull: Negative Sinuses/Orbits: Scattered opacified ethmoid air cells. Orbits negative. Other: None IMPRESSION: No acute intracranial finding. Atrophy and chronic small-vessel ischemic change of the white matter. Old right  frontal white matter infarction Electronically Signed   By: Nelson Chimes M.D.   On: 11/22/2018 09:26   Dg Chest Port 1 View  Result Date: 11/26/2018 CLINICAL DATA:  Abnormal respirations, confusion EXAM: PORTABLE CHEST 1 VIEW COMPARISON:  11/25/2018 FINDINGS: NG tube in place. The side port remains in the distal esophagus. Stable calcified mediastinal and bilateral hilar lymph nodes. Calcified granulomas bilaterally. Cardiomegaly. No overt edema or effusions. No confluent opacity. IMPRESSION: Cardiomegaly. Old granulomatous disease. No acute findings. Electronically Signed   By: Rolm Baptise M.D.   On: 11/26/2018 09:27   Dg Chest Port 1 View  Result Date: 11/25/2018 CLINICAL DATA:  Respiratory failure. EXAM: PORTABLE CHEST 1 VIEW COMPARISON:  Radiograph of November 24, 2018. FINDINGS: Stable cardiomegaly. Stable calcified mediastinal and bilateral hilar adenopathy. Interval placement of nasogastric tube with tip in proximal stomach. Side hole remains above the gastroesophageal junction. Old right rib fractures are noted. No pneumothorax is noted. Minimal right pleural effusion is noted. No consolidative process is noted. Bony thorax is unremarkable. IMPRESSION: Interval placement of a nasogastric tube with distal tip in proximal stomach, although side hole remains above gastroesophageal junction; advancement is recommended. Stable calcified mediastinal bilateral hilar adenopathy. Stable small pleural effusion. No other abnormality seen. Electronically Signed   By: Marijo Conception M.D.   On: 11/25/2018 08:24   Dg Chest Port 1 View  Result Date: 11/24/2018 CLINICAL DATA:  Acute respiratory failure with hypoxia. EXAM: PORTABLE CHEST 1 VIEW COMPARISON:  11/23/2018. FINDINGS: Interval removal of the endotracheal tube and nasogastric tube. Mild enlargement of the cardiac silhouette with mild progression. The lungs remain hyperexpanded with a small amount of bilateral pleural thickening or fluid and stable right  basilar scarring. Stable mild prominence of the interstitial markings. Stable calcified lymph nodes in granulomata. Mild scoliosis. IMPRESSION: 1. Mildly progressive cardiomegaly. 2. Stable changes of COPD. 3. Stable small amount of bilateral pleural thickening or fluid. Electronically Signed   By: Claudie Revering M.D.   On: 11/24/2018 08:16   Dg Chest Port 1 View  Result Date: 11/23/2018 CLINICAL DATA:  Acute respiratory failure. EXAM: PORTABLE CHEST 1 VIEW COMPARISON:  11/22/2018. FINDINGS: Endotracheal tube and NG tube in stable position. Heart size normal. Changes consistent with COPD and pleuroparenchymal scarring again noted. Mild left mid lung and bibasilar atelectasis/infiltrates cannot be excluded. Small right pleural effusion versus pleural scarring. No pneumothorax. No acute bony abnormality. IMPRESSION: 1.  Endotracheal tube and NG tube in stable position. 2. Changes consistent COPD and pleuroparenchymal scarring again noted. Mild left mid lung and bibasilar atelectasis/infiltrates cannot be excluded. Small right pleural effusion versus pleural scarring. No pneumothorax. Electronically Signed   By: Marcello Moores  Register   On: 11/23/2018 08:09   Dg Chest Portable 1 View  Result Date: 11/22/2018 CLINICAL DATA:  Intubated for shortness of breath. EXAM: PORTABLE CHEST 1 VIEW COMPARISON:  None. FINDINGS: Endotracheal tube is well positioned with tip approximately 5 cm above the carina. Enteric tube passes below the diaphragm. Lungs are hyperexpanded. Coarse lung markings  bilaterally suggesting some degree of chronic interstitial lung disease/fibrosis. Superimposed edema versus pneumonia in the LEFT perihilar lung. Small bilateral pleural effusions, versus more likely chronic bilateral pleural thickening. No pneumothorax seen. No acute appearing osseous abnormality. IMPRESSION: 1. Endotracheal tube well positioned with tip approximately 5 cm above the carina. 2. Enteric tube passes below the diaphragm. 3.  Hyperexpanded lungs indicating COPD. Coarse lung markings bilaterally suggesting some degree of chronic interstitial lung disease/fibrosis. 4. Superimposed edema versus pneumonia in the LEFT perihilar lung. Electronically Signed   By: Franki Cabot M.D.   On: 11/22/2018 06:04   Dg Abd Portable 1v  Result Date: 11/24/2018 CLINICAL DATA:  NG tube placement EXAM: PORTABLE ABDOMEN - 1 VIEW COMPARISON:  11/22/2018 FINDINGS: NG tube tip is in the proximal stomach with the side port in the distal esophagus. IMPRESSION: NG tube in the proximal stomach with the tip in the distal esophagus. Electronically Signed   By: Rolm Baptise M.D.   On: 11/24/2018 09:11    Time Spent in minutes 35   Mechele Kittleson M.D on 11/27/2018 at 1:48 PM  Between 7am to 7pm - Pager - [phone]  After 7pm go to [email or web address] - password [facility]  [facility] -  Office  [phone]

## 2018-11-27 NOTE — Progress Notes (Signed)
  Speech Language Pathology Treatment: Dysphagia  Patient Details Name: Molly Roberts MRN: [id number] DOB: 05-Aug-1950 Today's Date: 11/27/2018 Time: 2025-4270 SLP Time Calculation (min) (ACUTE ONLY): 10 min  Assessment / Plan / Recommendation Clinical Impression  Pt's mentation is improved today per RN report, although he also says that pt believes she is not back to baseline. Her NGT has been removed, so she no longer has c/o odynophagia. She believes that food and drink are still spilling out of her mouth as she tries to swallow, but across the course of PO trials this afternoon, this is not observed. Min cues were provided for labial seal and liquid wash though, as well as Mod cues for sustained attention. Recommend continuing Dys 3 diet and thin liquids. Will f/u for potential to advance if mentation continues to improve.    HPI HPI: 68 yo female former smoker with known ESRD on hemodialysis (T/TH/Sat) with respiratory distress found to be severely hypoxic with O2 sats 58% requiring intubation . Patient missed 1 dialysis session. She has medical history significant for COPD sarcoid and dementia.  Intubated 7/19-7/20.       SLP Plan  Continue with current plan of care       Recommendations  Diet recommendations: Dysphagia 3 (mechanical soft);Thin liquid Liquids provided via: Cup;Straw Medication Administration: Whole meds with liquid Supervision: Staff to assist with self feeding Compensations: Slow rate;Small sips/bites;Minimize environmental distractions Postural Changes and/or Swallow Maneuvers: Seated upright 90 degrees                Oral Care Recommendations: Oral care BID Follow up Recommendations: None SLP Visit Diagnosis: Dysphagia, unspecified (R13.10) Plan: Continue with current plan of care       GO                Venita Sheffield [name] 11/27/2018, 1:42 PM  [name], M.A. [place] Acute Environmental education officer [phone] Office [phone]

## 2018-11-27 NOTE — Progress Notes (Signed)
CRITICAL VALUE ALERT  Critical Value:  Potassium 6.6  Date & Time Notied:  11/27/18 1320  Provider Notified: Dr Clementeen Graham  Orders Received/Actions taken: repeat potassium lab due to partial hemolysis of other specimen

## 2018-11-27 NOTE — Plan of Care (Signed)
  Problem: Clinical Measurements: Goal: Respiratory complications will improve Outcome: Progressing   Problem: Clinical Measurements: Goal: Cardiovascular complication will be avoided Outcome: Progressing   Problem: Activity: Goal: Risk for activity intolerance will decrease Outcome: Progressing   Problem: Nutrition: Goal: Adequate nutrition will be maintained Outcome: Progressing   Problem: Coping: Goal: Level of anxiety will decrease Outcome: Progressing   Problem: Pain Managment: Goal: General experience of comfort will improve Outcome: Progressing   Problem: Safety: Goal: Ability to remain free from injury will improve Outcome: Progressing   Problem: Skin Integrity: Goal: Risk for impaired skin integrity will decrease Outcome: Progressing   

## 2018-11-28 DIAGNOSIS — J9601 Acute respiratory failure with hypoxia: Secondary | ICD-10-CM | POA: Diagnosis present

## 2018-11-28 DIAGNOSIS — F0150 Vascular dementia without behavioral disturbance: Secondary | ICD-10-CM | POA: Diagnosis present

## 2018-11-28 DIAGNOSIS — E872 Acidosis: Secondary | ICD-10-CM | POA: Diagnosis present

## 2018-11-28 DIAGNOSIS — J181 Lobar pneumonia, unspecified organism: Secondary | ICD-10-CM | POA: Diagnosis present

## 2018-11-28 DIAGNOSIS — N186 End stage renal disease: Secondary | ICD-10-CM

## 2018-11-28 DIAGNOSIS — E43 Unspecified severe protein-calorie malnutrition: Secondary | ICD-10-CM | POA: Diagnosis present

## 2018-11-28 DIAGNOSIS — J9602 Acute respiratory failure with hypercapnia: Secondary | ICD-10-CM | POA: Diagnosis present

## 2018-11-28 DIAGNOSIS — I10 Essential (primary) hypertension: Secondary | ICD-10-CM | POA: Insufficient documentation

## 2018-11-28 DIAGNOSIS — Z992 Dependence on renal dialysis: Secondary | ICD-10-CM

## 2018-11-28 DIAGNOSIS — J441 Chronic obstructive pulmonary disease with (acute) exacerbation: Secondary | ICD-10-CM | POA: Diagnosis present

## 2018-11-28 DIAGNOSIS — E8729 Other acidosis: Secondary | ICD-10-CM | POA: Diagnosis present

## 2018-11-28 DIAGNOSIS — F0151 Vascular dementia with behavioral disturbance: Secondary | ICD-10-CM

## 2018-11-28 DIAGNOSIS — E875 Hyperkalemia: Secondary | ICD-10-CM | POA: Diagnosis present

## 2018-11-28 LAB — GLUCOSE, CAPILLARY
Glucose-Capillary: 168 mg/dL — ABNORMAL HIGH (ref 70–99)
Glucose-Capillary: 140 mg/dL — ABNORMAL HIGH (ref 70–99)
Glucose-Capillary: 168 mg/dL — ABNORMAL HIGH (ref 70–99)
Glucose-Capillary: 172 mg/dL — ABNORMAL HIGH (ref 70–99)

## 2018-11-28 MED ORDER — HEPARIN SODIUM (PORCINE) 1000 UNIT/ML DIALYSIS
20.0000 [IU]/kg | INTRAMUSCULAR | Status: DC | PRN
  Filled 2018-11-28: qty 1

## 2018-11-28 MED ORDER — SODIUM CHLORIDE 0.9 % IV BOLUS
500.0000 mL | Freq: Once | INTRAVENOUS | Status: AC
  Administered 2018-11-28: 500 mL via INTRAVENOUS

## 2018-11-28 NOTE — Discharge Summary (Signed)
Physician Discharge Summary  Molly Roberts EXN:[id number] DOB: 26-Feb-1951 DOA: 11/22/2018  PCP: Jearld Shines in [place] date: 11/22/2018 Discharge date: 11/28/2018  Admitted From: Home Disposition: Home  Recommendations for Outpatient Follow-up:  Follow-up with scheduled hemodialysis.  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  They placed her on nonrebreather with some improvement in the 80s and then on CPAP however patient became obtunded.  Blood sugar was in the 300s.  In the ED chest x-ray showed chronic interstitial lung disease and COPD changes with increased BNP, lactic acid of 1.9.  SARS- COV-2 tested negative in the ED.  Patient intubated and admitted to the ICU. Patient placed on empiric vancomycin and cefepime. She was extubated on 7/20 and transferred to hospitalist service on 7/22.   Hospital course  Principal problem Acute on chronic respiratory failure with hypoxemia (HCC) Left lobar pneumonia (HCC) Secondary to volume overload from missed hemodialysis and possible left-sided pneumonia versus bronchitis.  Required intubation for airway protection. Tracheal aspirate from 7/19 with rare WBC and rare GPC in  clusters. Patient was extubated on 7/20.    Treated with 7 days of IV Rocephin Stable on room air.  Active problems  Hypoglycemia on 7/24 No history of diabetes.  CBG dropped to 30s possibly after receiving sliding scale insulin correction.  Now stable and taking adequate p.o.  Dementia with delirium. Patient has been oriented to conversation. As per her daughter patient has some mild forgetfulness (short-term memory loss) which started after she had a mini stroke 4 years ago.  At baseline she is capable of her ADLs and occasionally also manages her accounts. Continue Namenda and Aricept.   lethargy on 7/25. Patient reportedly took?  Klonopin that she brought from home.  She is currently placed on low-dose Klonopin twice daily as needed (home dose).  Was found to be lethargic after returning from dialysis.  On my evaluation she is waking up well and answering questions appropriately.  Instructed that she should not take scheduled benzodiazepine and use it only as needed for anxiety.  Uncontrolled hypertension Home med list shows that she is on 6 different blood pressure meds (Procardia, Coreg, labetalol, losartan, hydralazine, BiDil).  Her blood pressure has been stable on Coreg and PRN hydralazine.  I will discharge her on Coreg, losartan and BiDil only.  Discontinue labetalol, Procardia and hydralazine.  ESRD on hemodialysis (TTS) Received her scheduled dialysis today.  Nephrology following while in the hospital.  Leukocytosis Possibly due to IV steroid use  COPD with sarcoid Continue home dose prednisone.    Resume home inhaler and nebs.  Protein calorie malnutrition, severe (BMI of 14.6 kg/m) Recommend outpatient nutrition evaluation.  Generalized weakness PT recommends 24-hour home supervision.  Daughter says that both she and her sister can provide  enough supervision for her at home.  Will order home health PT   Patient clinically stable and can be discharged  home  Family Communication  : spoke with daughter on the phone  Disposition Plan  : home    Consults  : Renal, PCCM  Procedures  : Intubation, CT head  Discharge Instructions   Allergies as of 11/28/2018      Reactions   Ativan [lorazepam] Other (See Comments)   confusion   Codeine Other (See Comments)   confusion   Morphine And Related Other (See Comments)   agitation      Medication List    STOP taking these medications   hydrALAZINE 10 MG tablet Commonly known as: APRESOLINE   HYDROcodone-acetaminophen 5-325 MG tablet Commonly known as: NORCO/VICODIN   labetalol 200 MG tablet Commonly known as: NORMODYNE   NIFEdipine 60 MG 24 hr tablet Commonly known as: PROCARDIA XL/NIFEDICAL XL   pantoprazole 40 MG tablet Commonly known as: PROTONIX     TAKE these medications   acetaminophen 500 MG tablet Commonly known as: TYLENOL Take 500-1,000 mg by mouth every 6 (six) hours as needed for mild pain or fever.   albuterol 108 (90 Base) MCG/ACT inhaler Commonly known as: VENTOLIN HFA Inhale 2 puffs into the lungs every 6 (six) hours as needed for wheezing or shortness of breath.   anti-nausea solution Take 10 mLs by mouth every 15 (fifteen) minutes as needed for nausea or vomiting.   aspirin EC 81 MG tablet Take 81 mg by mouth daily.   atorvastatin 10 MG tablet Commonly known as: LIPITOR Take 10 mg by mouth daily.   bismuth subsalicylate 244 WN/02VO suspension Commonly known as: PEPTO BISMOL Take 30 mLs by mouth every 6 (six) hours as needed for indigestion.   budesonide-formoterol 160-4.5 MCG/ACT inhaler Commonly known as: SYMBICORT Inhale 2 puffs into the lungs 2 (two) times daily.   calcium carbonate 750 MG chewable tablet Commonly known as: TUMS EX Chew 1-2 tablets by mouth daily as needed for heartburn.   carvedilol 12.5 MG tablet Commonly known as: COREG Take 12.5 mg by mouth 2 (two) times daily with a meal.   clonazePAM 0.5 MG  tablet Commonly known as: KLONOPIN Take 0.5 mg by mouth 2 (two) times daily as needed for anxiety.   dicyclomine 20 MG tablet Commonly known as: BENTYL Take 20 mg by mouth every 8 (eight) hours as needed for spasms.   donepezil 5 MG tablet Commonly known as: ARICEPT Take 10 mg by mouth at bedtime.   famotidine 20 MG tablet Commonly known as: PEPCID Take 20 mg by mouth daily.   ferrous sulfate 325 (65 FE) MG tablet Take 325 mg by mouth daily with breakfast.   isosorbide-hydrALAZINE 20-37.5 MG tablet Commonly known as: BIDIL Take 1 tablet by mouth 3 (three) times daily.   losartan 50 MG tablet Commonly known as: COZAAR Take 50 mg by mouth daily.   montelukast 10 MG tablet Commonly known as: SINGULAIR Take 10 mg by mouth at bedtime.   Namenda XR 28 MG Cp24 24 hr capsule Generic drug: memantine Take 28 mg by mouth daily.   ondansetron 4 MG tablet Commonly known as: ZOFRAN Take 4 mg by mouth every 8 (eight) hours as needed for nausea or vomiting.   predniSONE 10 MG tablet Commonly known as: DELTASONE Take 10 mg by mouth daily with breakfast.   theophylline 300 MG 12 hr tablet Commonly known as: THEODUR Take 300 mg by mouth daily.  Trelegy Ellipta 100-62.5-25 MCG/INH Aepb Generic drug: Fluticasone-Umeclidin-Vilant Inhale 1 puff into the lungs 2 (two) times daily.   Valerian 500 MG Caps Take 1-2 capsules by mouth at bedtime as needed (sleep).      Follow-up Information    nephrology in [place] Follow up in 1 week(s).          Allergies  Allergen Reactions  . Ativan [Lorazepam] Other (See Comments)    confusion  . Codeine Other (See Comments)    confusion  . Morphine And Related Other (See Comments)    agitation      Procedures/Studies: Dg Abd 1 View  Result Date: 11/22/2018 CLINICAL DATA:  NG tube placement. EXAM: ABDOMEN - 1 VIEW COMPARISON:  None. FINDINGS: Enteric tube appears adequately positioned in the stomach. Overall bowel gas pattern is  nonobstructive. No evidence of free intraperitoneal air. IMPRESSION: Enteric tube appears adequately positioned in the stomach. Electronically Signed   By: Franki Cabot M.D.   On: 11/22/2018 11:21   Ct Head Wo Contrast  Result Date: 11/22/2018 CLINICAL DATA:  Found with altered mental status and difficulty breathing. EXAM: CT HEAD WITHOUT CONTRAST TECHNIQUE: Contiguous axial images were obtained from the base of the skull through the vertex without intravenous contrast. COMPARISON:  None. FINDINGS: Brain: Age related atrophy. Chronic small-vessel ischemic changes of the white matter. 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 Physiologic basal ganglia calcification. No sign of acute infarction, mass lesion, hemorrhage, hydrocephalus or extra-axial collection. Vascular: There is atherosclerotic calcification of the major vessels at the base of the brain. Skull: Negative Sinuses/Orbits: Scattered opacified ethmoid air cells. Orbits negative. Other: None IMPRESSION: No acute intracranial finding. Atrophy and chronic small-vessel ischemic change of the white matter. 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   On: 11/22/2018 09:26   Dg Chest Port 1 View  Result Date: 11/26/2018 CLINICAL DATA:  Abnormal respirations, confusion EXAM: PORTABLE CHEST 1 VIEW COMPARISON:  11/25/2018 FINDINGS: NG tube in place. The side port remains in the distal esophagus. Stable calcified mediastinal and bilateral hilar lymph nodes. Calcified granulomas bilaterally. Cardiomegaly. No overt edema or effusions. No confluent opacity. IMPRESSION: Cardiomegaly. Old granulomatous disease. No acute findings. Electronically Signed   By: Rolm Baptise M.D.   On: 11/26/2018 09:27   Dg Chest Port 1 View  Result Date: 11/25/2018 CLINICAL DATA:  Respiratory failure. EXAM: PORTABLE CHEST 1 VIEW COMPARISON:  Radiograph of November 24, 2018. FINDINGS: Stable cardiomegaly. Stable calcified mediastinal and  bilateral hilar adenopathy. Interval placement of nasogastric tube with tip in proximal stomach. Side hole remains above the gastroesophageal junction. Old right rib fractures are noted. No pneumothorax is noted. Minimal right pleural effusion is noted. No consolidative process is noted. Bony thorax is unremarkable. IMPRESSION: Interval placement of a nasogastric tube with distal tip in proximal stomach, although side hole remains above gastroesophageal junction; advancement is recommended. Stable calcified mediastinal bilateral hilar adenopathy. Stable small pleural effusion. No other abnormality seen. Electronically Signed   By: Marijo Conception M.D.   On: 11/25/2018 08:24   Dg Chest Port 1 View  Result Date: 11/24/2018 CLINICAL DATA:  Acute respiratory failure with hypoxia. EXAM: PORTABLE CHEST 1 VIEW COMPARISON:  11/23/2018. FINDINGS: Interval removal of the endotracheal tube and nasogastric tube. Mild enlargement of the cardiac silhouette with mild progression. The lungs remain hyperexpanded with a small amount of bilateral pleural thickening or fluid and stable right basilar scarring. Stable mild prominence of the interstitial markings.  Stable calcified lymph nodes in granulomata. Mild scoliosis. IMPRESSION: 1. Mildly progressive cardiomegaly. 2. Stable changes of COPD. 3. Stable small amount of bilateral pleural thickening or fluid. Electronically Signed   By: Claudie Revering M.D.   On: 11/24/2018 08:16   Dg Chest Port 1 View  Result Date: 11/23/2018 CLINICAL DATA:  Acute respiratory failure. EXAM: PORTABLE CHEST 1 VIEW COMPARISON:  11/22/2018. FINDINGS: Endotracheal tube and NG tube in stable position. Heart size normal. Changes consistent with COPD and pleuroparenchymal scarring again noted. Mild left mid lung and bibasilar atelectasis/infiltrates cannot be excluded. Small right pleural effusion versus pleural scarring. No pneumothorax. No acute bony abnormality. IMPRESSION: 1.  Endotracheal tube and NG  tube in stable position. 2. Changes consistent COPD and pleuroparenchymal scarring again noted. Mild left mid lung and bibasilar atelectasis/infiltrates cannot be excluded. Small right pleural effusion versus pleural scarring. No pneumothorax. Electronically Signed   By: Marcello Moores  Register   On: 11/23/2018 08:09   Dg Chest Portable 1 View  Result Date: 11/22/2018 CLINICAL DATA:  Intubated for shortness of breath. EXAM: PORTABLE CHEST 1 VIEW COMPARISON:  None. FINDINGS: Endotracheal tube is well positioned with tip approximately 5 cm above the carina. Enteric tube passes below the diaphragm. Lungs are hyperexpanded. Coarse lung markings bilaterally suggesting some degree of chronic interstitial lung disease/fibrosis. Superimposed edema versus pneumonia in the LEFT perihilar lung. Small bilateral pleural effusions, versus more likely chronic bilateral pleural thickening. No pneumothorax seen. No acute appearing osseous abnormality. IMPRESSION: 1. Endotracheal tube well positioned with tip approximately 5 cm above the carina. 2. Enteric tube passes below the diaphragm. 3. Hyperexpanded lungs indicating COPD. Coarse lung markings bilaterally suggesting some degree of chronic interstitial lung disease/fibrosis. 4. Superimposed edema versus pneumonia in the LEFT perihilar lung. Electronically Signed   By: Franki Cabot M.D.   On: 11/22/2018 06:04   Dg Abd Portable 1v  Result Date: 11/24/2018 CLINICAL DATA:  NG tube placement EXAM: PORTABLE ABDOMEN - 1 VIEW COMPARISON:  11/22/2018 FINDINGS: NG tube tip is in the proximal stomach with the side port in the distal esophagus. IMPRESSION: NG tube in the proximal stomach with the tip in the distal esophagus. Electronically Signed   By: Rolm Baptise M.D.   On: 11/24/2018 09:11       Subjective: Went for dialysis today.  Was found to be lethargic after that.  Woke up slowly and told that she took an orange pill that she brought from home (on questioning confirm that  it was Klonopin)  Discharge Exam: Vitals:   11/28/18 1050 11/28/18 1120  BP: 124/66 130/71  Pulse: (!) 104 (!) 109  Resp: 16 20  Temp:  98.4 F (36.9 C)  SpO2:  97%   Vitals:   11/28/18 1030 11/28/18 1045 11/28/18 1050 11/28/18 1120  BP: 122/78 126/70 124/66 130/71  Pulse: (!) 108 (!) 106 (!) 104 (!) 109  Resp: 18 18 16 20   Temp:  98.2 F (36.8 C)  98.4 F (36.9 C)  TempSrc:  Oral  Oral  SpO2:  96%  97%  Weight:  43.8 kg    Height:        General: Fatigued, not in distress, HEENT: Moist mucosa, supple neck, temporal wasting Chest: Clear bilaterally CVS: Normal S1-S2, no murmurs GI: Soft, nondistended, nontender Musculoskeletal: Warm, no edema CNS: Alert and oriented x2     The results of significant diagnostics from this hospitalization (including imaging, microbiology, ancillary and laboratory) are listed below for reference.  Microbiology: Recent Results (from the past 240 hour(s))  SARS Coronavirus 2 (CEPHEID - Performed in [place] hospital lab), Hosp Order     Status: None   Collection Time: 11/22/18  5:38 AM   Specimen: Nasopharyngeal Swab  Result Value Ref Range Status   SARS Coronavirus 2 NEGATIVE NEGATIVE Final    Comment: (NOTE) If result is NEGATIVE SARS-CoV-2 target nucleic acids are NOT DETECTED. The SARS-CoV-2 RNA is generally detectable in upper and lower  respiratory specimens during the acute phase of infection. The lowest  concentration of SARS-CoV-2 viral copies this assay can detect is 250  copies / mL. A negative result does not preclude SARS-CoV-2 infection  and should not be used as the sole basis for treatment or other  patient management decisions.  A negative result may occur with  improper specimen collection / handling, submission of specimen other  than nasopharyngeal swab, presence of viral mutation(s) within the  areas targeted by this assay, and inadequate number of viral copies  (<250 copies / mL). A negative result  must be combined with clinical  observations, patient history, and epidemiological information. If result is POSITIVE SARS-CoV-2 target nucleic acids are DETECTED. The SARS-CoV-2 RNA is generally detectable in upper and lower  respiratory specimens dur ing the acute phase of infection.  Positive  results are indicative of active infection with SARS-CoV-2.  Clinical  correlation with patient history and other diagnostic information is  necessary to determine patient infection status.  Positive results do  not rule out bacterial infection or co-infection with other viruses. If result is PRESUMPTIVE POSTIVE SARS-CoV-2 nucleic acids MAY BE PRESENT.   A presumptive positive result was obtained on the submitted specimen  and confirmed on repeat testing.  While 2019 novel coronavirus  (SARS-CoV-2) nucleic acids may be present in the submitted sample  additional confirmatory testing may be necessary for epidemiological  and / or clinical management purposes  to differentiate between  SARS-CoV-2 and other Sarbecovirus currently known to infect humans.  If clinically indicated additional testing with an alternate test  methodology [phone]) is advised. The SARS-CoV-2 RNA is generally  detectable in upper and lower respiratory sp ecimens during the acute  phase of infection. The expected result is Negative. Fact Sheet for Patients:  StrictlyIdeas.no Fact Sheet for Healthcare Providers: BankingDealers.co.za This test is not yet approved or cleared by the [place] FDA and has been authorized for detection and/or diagnosis of SARS-CoV-2 by FDA under an Emergency Use Authorization (EUA).  This EUA will remain in effect (meaning this test can be used) for the duration of the COVID-19 declaration under Section 564(b)(1) of the Act, 21 U.S.C. section 360bbb-3(b)(1), unless the authorization is terminated or revoked sooner. Performed at [facility], [place] [address], [place], [place] [address]   Blood culture (routine x 2)     Status: None   Collection Time: 11/22/18  6:00 AM   Specimen: BLOOD RIGHT HAND  Result Value Ref Range Status   Specimen Description BLOOD RIGHT HAND  Final   Special Requests   Final    Blood Culture results may not be optimal due to an inadequate volume of blood received in culture bottles   Culture   Final    NO GROWTH 5 DAYS Performed at [facility], [place] [address], [place], [place] [address]    Report Status 11/27/2018 FINAL  Final  Blood culture (routine x 2)     Status: None   Collection  Time: 11/22/18  6:13 AM   Specimen: BLOOD RIGHT WRIST  Result Value Ref Range Status   Specimen Description BLOOD RIGHT WRIST  Final   Special Requests   Final    BOTTLES DRAWN AEROBIC AND ANAEROBIC Blood Culture results may not be optimal due to an inadequate volume of blood received in culture bottles   Culture   Final    NO GROWTH 5 DAYS Performed at [facility], [place] [address], [place], [place] [address]    Report Status 11/27/2018 FINAL  Final  Culture, respiratory (non-expectorated)     Status: None   Collection Time: 11/22/18  9:50 AM   Specimen: Tracheal Aspirate; Respiratory  Result Value Ref Range Status   Specimen Description TRACHEAL ASPIRATE  Final   Special Requests NONE  Final   Gram Stain   Final    RARE WBC PRESENT, PREDOMINANTLY MONONUCLEAR RARE GRAM POSITIVE COCCI IN CLUSTERS    Culture   Final    Consistent with normal respiratory flora. Performed at [facility], [place] [address], [place], [place] [address]    Report Status 11/24/2018 FINAL  Final  MRSA PCR Screening     Status: None   Collection Time: 11/22/18 11:34 AM   Specimen: Nasal Mucosa; Nasopharyngeal  Result Value Ref Range Status   MRSA by PCR NEGATIVE NEGATIVE Final    Comment:        The GeneXpert MRSA Assay (FDA approved for NASAL specimens only), is one component of a comprehensive MRSA  colonization surveillance program. It is not intended to diagnose MRSA infection nor to guide or monitor treatment for MRSA infections. Performed at [facility], [place] [address] [place], [place] [address]      Labs: BNP (last 3 results) Recent Labs    11/22/18 0600  BNP 102.7*   Basic Metabolic Panel: Recent Labs  Lab 11/23/18 0758 11/24/18 0251 11/24/18 1509 11/25/18 0245 11/25/18 1809 11/26/18 0542 11/27/18 1250 11/27/18 1549  NA 131* 136  --  135  --  136 137  --   K 3.7 4.6  --  4.4  --  4.6 6.6* 4.9  CL 93* 98  --  96*  --  94* 97*  --   CO2 22 26  --  25  --  23 21*  --   GLUCOSE 91 78  --  134*  --  149* 178*  --   BUN 43* 22  --  37*  --  64* 42*  --   CREATININE 7.34* 5.03*  --  4.46*  --  7.30* 6.17*  --   CALCIUM 9.0 8.4*  --  8.7*  --  8.8* 9.5  --   MG  --   --  2.0 2.2 2.4 2.4  --   --   PHOS  --   --  4.6 5.7* 5.8* 5.1* 4.7*  --    Liver Function Tests: Recent Labs  Lab 11/22/18 0547 11/22/18 1445 11/27/18 1250  AST 72*  --   --   ALT 45*  --   --   ALKPHOS 121  --   --   BILITOT 0.7  --   --   PROT 6.5  --   --   ALBUMIN 3.6 3.6 3.8   No results for input(s): LIPASE, AMYLASE in the last 168 hours. No results for input(s): AMMONIA in the last 168 hours. CBC: Recent Labs  Lab 11/22/18 0547  11/23/18 1631 11/24/18  5277 11/25/18 0245 11/26/18 0542 11/27/18 1250  WBC 16.0*   < > 15.9* 17.2* 18.1* 20.1* 11.3*  NEUTROABS 14.5*  --   --   --  15.3* 17.2*  --   HGB 10.4*   < > 10.2* 9.5* 10.1* 11.2* 11.0*  HCT 34.9*   < > 30.8* 29.6* 32.6* 36.0 34.1*  MCV 96.4   < > 90.1 91.6 93.1 93.5 89.3  PLT 188   < > 193 122* 179 201 227   < > = values in this interval not displayed.   Cardiac Enzymes: No results for input(s): CKTOTAL, CKMB, CKMBINDEX, TROPONINI in the last 168 hours. BNP: Invalid input(s): POCBNP CBG: Recent Labs  Lab 11/27/18 1542 11/27/18 1948 11/27/18 2346 11/28/18 0420 11/28/18 1109  GLUCAP 147* 124* 121* 168*  140*   D-Dimer No results for input(s): DDIMER in the last 72 hours. Hgb A1c No results for input(s): HGBA1C in the last 72 hours. Lipid Profile No results for input(s): CHOL, HDL, LDLCALC, TRIG, CHOLHDL, LDLDIRECT in the last 72 hours. Thyroid function studies No results for input(s): TSH, T4TOTAL, T3FREE, THYROIDAB in the last 72 hours.  Invalid input(s): FREET3 Anemia work up No results for input(s): VITAMINB12, FOLATE, FERRITIN, TIBC, IRON, RETICCTPCT in the last 72 hours. Urinalysis No results found for: COLORURINE, APPEARANCEUR, LABSPEC, [place], GLUCOSEU, [place], [place], KETONESUR, PROTEINUR, UROBILINOGEN, NITRITE, LEUKOCYTESUR Sepsis Labs Invalid input(s): PROCALCITONIN,  WBC,  LACTICIDVEN Microbiology Recent Results (from the past 240 hour(s))  SARS Coronavirus 2 (CEPHEID - Performed in [place] hospital lab), Hosp Order     Status: None   Collection Time: 11/22/18  5:38 AM   Specimen: Nasopharyngeal Swab  Result Value Ref Range Status   SARS Coronavirus 2 NEGATIVE NEGATIVE Final    Comment: (NOTE) If result is NEGATIVE SARS-CoV-2 target nucleic acids are NOT DETECTED. The SARS-CoV-2 RNA is generally detectable in upper and lower  respiratory specimens during the acute phase of infection. The lowest  concentration of SARS-CoV-2 viral copies this assay can detect is 250  copies / mL. A negative result does not preclude SARS-CoV-2 infection  and should not be used as the sole basis for treatment or other  patient management decisions.  A negative result may occur with  improper specimen collection / handling, submission of specimen other  than nasopharyngeal swab, presence of viral mutation(s) within the  areas targeted by this assay, and inadequate number of viral copies  (<250 copies / mL). A negative result must be combined with clinical  observations, patient history, and epidemiological information. If result is POSITIVE SARS-CoV-2 target nucleic acids are  DETECTED. The SARS-CoV-2 RNA is generally detectable in upper and lower  respiratory specimens dur ing the acute phase of infection.  Positive  results are indicative of active infection with SARS-CoV-2.  Clinical  correlation with patient history and other diagnostic information is  necessary to determine patient infection status.  Positive results do  not rule out bacterial infection or co-infection with other viruses. If result is PRESUMPTIVE POSTIVE SARS-CoV-2 nucleic acids MAY BE PRESENT.   A presumptive positive result was obtained on the submitted specimen  and confirmed on repeat testing.  While 2019 novel coronavirus  (SARS-CoV-2) nucleic acids may be present in the submitted sample  additional confirmatory testing may be necessary for epidemiological  and / or clinical management purposes  to differentiate between  SARS-CoV-2 and other Sarbecovirus currently known to infect humans.  If clinically indicated additional testing with an alternate test  methodology [phone]) is advised. The SARS-CoV-2 RNA is generally  detectable in upper and lower respiratory sp ecimens during the acute  phase of infection. The expected result is Negative. Fact Sheet for Patients:  StrictlyIdeas.no Fact Sheet for Healthcare Providers: BankingDealers.co.za This test is not yet approved or cleared by the [place] FDA and has been authorized for detection and/or diagnosis of SARS-CoV-2 by FDA under an Emergency Use Authorization (EUA).  This EUA will remain in effect (meaning this test can be used) for the duration of the COVID-19 declaration under Section 564(b)(1) of the Act, 21 U.S.C. section 360bbb-3(b)(1), unless the authorization is terminated or revoked sooner. Performed at [facility], [place] [address] [place], [place] [address]   Blood culture (routine x 2)     Status: None   Collection Time: 11/22/18  6:00 AM   Specimen: BLOOD  RIGHT HAND  Result Value Ref Range Status   Specimen Description BLOOD RIGHT HAND  Final   Special Requests   Final    Blood Culture results may not be optimal due to an inadequate volume of blood received in culture bottles   Culture   Final    NO GROWTH 5 DAYS Performed at [facility], [place] [address], [place], [place] [address]    Report Status 11/27/2018 FINAL  Final  Blood culture (routine x 2)     Status: None   Collection Time: 11/22/18  6:13 AM   Specimen: BLOOD RIGHT WRIST  Result Value Ref Range Status   Specimen Description BLOOD RIGHT WRIST  Final   Special Requests   Final    BOTTLES DRAWN AEROBIC AND ANAEROBIC Blood Culture results may not be optimal due to an inadequate volume of blood received in culture bottles   Culture   Final    NO GROWTH 5 DAYS Performed at [facility], [place] [address] [place], [place] [address]    Report Status 11/27/2018 FINAL  Final  Culture, respiratory (non-expectorated)     Status: None   Collection Time: 11/22/18  9:50 AM   Specimen: Tracheal Aspirate; Respiratory  Result Value Ref Range Status   Specimen Description TRACHEAL ASPIRATE  Final   Special Requests NONE  Final   Gram Stain   Final    RARE WBC PRESENT, PREDOMINANTLY MONONUCLEAR RARE GRAM POSITIVE COCCI IN CLUSTERS    Culture   Final    Consistent with normal respiratory flora. Performed at [facility], [place] [address] [place], [place] [address]    Report Status 11/24/2018 FINAL  Final  MRSA PCR Screening     Status: None   Collection Time: 11/22/18 11:34 AM   Specimen: Nasal Mucosa; Nasopharyngeal  Result Value Ref Range Status   MRSA by PCR NEGATIVE NEGATIVE Final    Comment:        The GeneXpert MRSA Assay (FDA approved for NASAL specimens only), is one component of a comprehensive MRSA colonization surveillance program. It is not intended to diagnose MRSA infection nor to guide or monitor treatment for MRSA infections. Performed at [facility], [place] [address], [place], [place] [address]      Time coordinating discharge: 35 minutes  SIGNED:   Louellen Molder, MD  [facility] 11/28/2018, 2:05 PM Pager   If 7PM-7AM, please contact night-coverage [email or web address] Password [email or web address]

## 2018-11-28 NOTE — Progress Notes (Signed)
Pt returned from dialysis to room via bed. Lethargic but arousable by touch. Pt admits to "I took a pill my doctor gave me for my nerves." Pt denies other complaints. Stating she is going home today. VSS, CBG 140, asking for breakfast.   Right upper arm PIV occluded, New PIV started in posterior forearm for IV abx administration. PO meds given.   pt now awake and eating breakfast. Remains drowsy. Able to state where she is, confused on day of week ,stating "Tuesday the 15th, 16th"   Nursing identified blue pill box in purse with many different pills. Pt points to different pills when asked which pill did she take. Unable to identify which med was taken due to confusion.

## 2018-11-28 NOTE — Care Management (Addendum)
[place] patient at bedside. She states she is from [place]. She states she has home oxygen and her daughter will be picking her up from the hospital and will have oxygen for transport home. She is visiting with her daughter who is from [place]. Tried to contact her daughter to clarify if she will need [facility] services for [place] or if she is going back to [place], unidentified number, unclear if number went to VM, attempted to leave hippa compliant request for callback. Awaiting to hear back from her.  [phone] Received call back from daughter who states that she has portable oxygen concentrator with device to hook up to outlet in car. She states she is 4 hours away in [place] but will still likely pick her up tonight. I discussed concern for safety driving back 4 hours and not getting back to [place] after midnight and she will consider it but said she will most likely be leaving soon to get patient. She has [facility] set up in [place], no new referral needed.

## 2018-11-28 NOTE — Progress Notes (Signed)
Pt was cleared for d/c by Dr Clementeen Graham despite BP issues.   Placed call to pts daughter to inquire about ETA for pickup. Daughter states that she is unable to arrive until tomorrow morning due to car issues.   MD paged and notified

## 2018-11-28 NOTE — Progress Notes (Signed)
Pt now states "it was an orange pill" asked if it was clonazepam and patient states "Yes."

## 2018-11-28 NOTE — Procedures (Signed)
I was present at this dialysis session. I have reviewed the session itself and made appropriate changes.   2K. 2L UF using AVG.  No new issues. OK for discharge and pt knows needs to return home for outpt HD.    Filed Weights   11/27/18 0316 11/28/18 0300 11/28/18 0705  Weight: 41.2 kg 45.9 kg 45.9 kg    Recent Labs  Lab 11/27/18 1250 11/27/18 1549  NA 137  --   K 6.6* 4.9  CL 97*  --   CO2 21*  --   GLUCOSE 178*  --   BUN 42*  --   CREATININE 6.17*  --   CALCIUM 9.5  --   PHOS 4.7*  --     Recent Labs  Lab 11/22/18 0547  11/25/18 0245 11/26/18 0542 11/27/18 1250  WBC 16.0*   < > 18.1* 20.1* 11.3*  NEUTROABS 14.5*  --  15.3* 17.2*  --   HGB 10.4*   < > 10.1* 11.2* 11.0*  HCT 34.9*   < > 32.6* 36.0 34.1*  MCV 96.4   < > 93.1 93.5 89.3  PLT 188   < > 179 201 227   < > = values in this interval not displayed.    Scheduled Meds: . aspirin  325 mg Oral Daily  . B-complex with vitamin C  1 tablet Oral Daily  . budesonide (PULMICORT) nebulizer solution  0.25 mg Nebulization BID  . carvedilol  12.5 mg Oral BID WC  . Chlorhexidine Gluconate Cloth  6 each Topical Q0600  . famotidine  20 mg Oral Daily  . heparin  5,000 Units Subcutaneous Q8H  . isosorbide-hydrALAZINE  1 tablet Oral TID  . losartan  50 mg Oral Daily  . mouth rinse  15 mL Mouth Rinse BID  . predniSONE  10 mg Oral Q breakfast  . senna  1 tablet Oral BID   Continuous Infusions: . sodium chloride Stopped (11/25/18 1744)  . cefTRIAXone (ROCEPHIN)  IV 1 g (11/27/18 1305)  . feeding supplement (VITAL 1.5 CAL) Stopped (11/26/18 1030)   PRN Meds:.acetaminophen (TYLENOL) oral liquid 160 mg/5 mL, albuterol, benzonatate, clonazepam, haloperidol lactate, heparin, hydrALAZINE, ondansetron (ZOFRAN) IV   Pearson Grippe  MD 11/28/2018, 7:49 AM

## 2018-11-29 LAB — GLUCOSE, CAPILLARY
Glucose-Capillary: 113 mg/dL — ABNORMAL HIGH (ref 70–99)
Glucose-Capillary: 123 mg/dL — ABNORMAL HIGH (ref 70–99)
Glucose-Capillary: 127 mg/dL — ABNORMAL HIGH (ref 70–99)

## 2018-11-29 MED ORDER — CARVEDILOL 12.5 MG PO TABS: 6.2500 mg | ORAL_TABLET | Freq: Two times a day (BID) | ORAL | 0 refills | Status: AC

## 2018-11-29 NOTE — Progress Notes (Signed)
Patient is educated about her discharge medications and advised to schedule appointment with her primary and nephrologist. Pt reports she feels week today. Vital signs are stable , BP 120/64, HR 97. Pt reports slight nausea, ginger ale is provided. Md is notified about discharge.

## 2019-02-04 DEATH — deceased

## 2020-04-12 IMAGING — DX PORTABLE ABDOMEN - 1 VIEW
1 series · 1 of 1 positions shown · non-contrast
Comparison: 11/22/2018

CLINICAL DATA: NG tube placement

EXAM:
PORTABLE ABDOMEN - 1 VIEW

[abdomen kub]
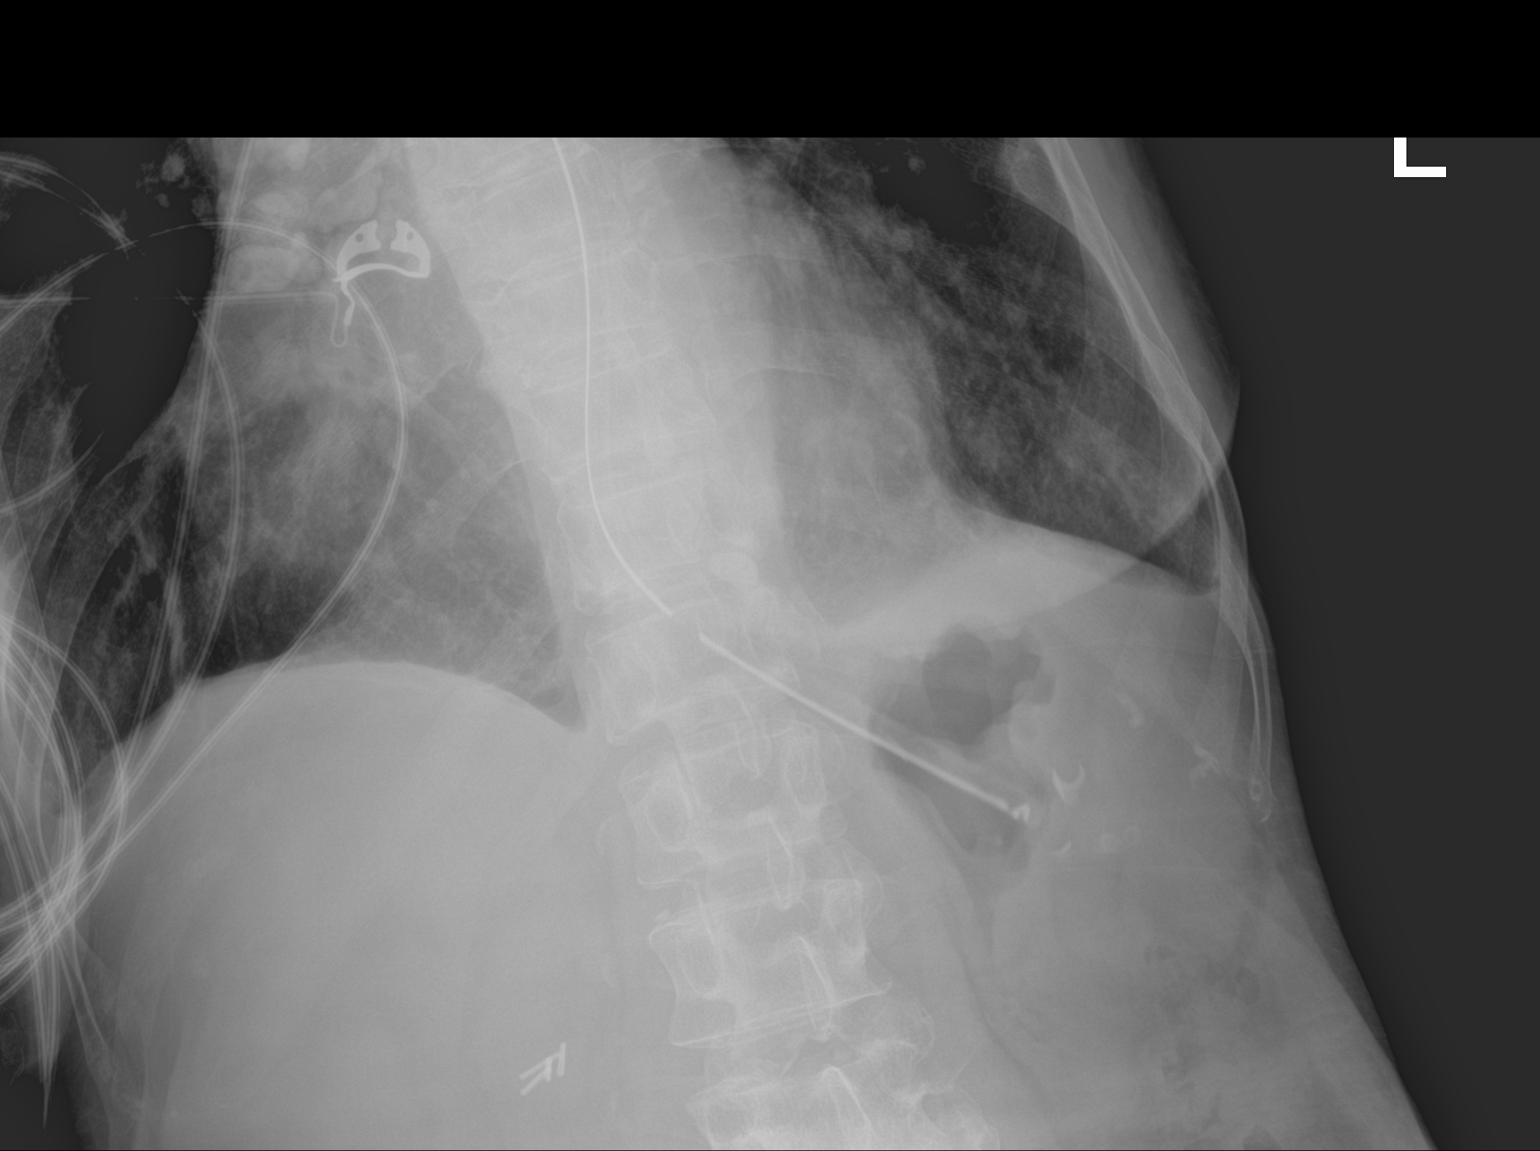

[1 of 1 positions shown; findings below may reference images not displayed]

FINDINGS: NG tube tip is in the proximal stomach with the side port in the
distal esophagus.
IMPRESSION: NG tube in the proximal stomach with the tip in the distal
esophagus.
